# Patient Record
Sex: Male | Born: 1950 | Race: White | Hispanic: No | Marital: Married | Smoking: Former smoker
Health system: Southern US, Community
[De-identification: ages and names within clinical notes are randomized; demographics above are authoritative.]

## PROBLEM LIST (undated history)

## (undated) ENCOUNTER — Emergency Department (HOSPITAL_COMMUNITY)

## (undated) DIAGNOSIS — Z1211 Encounter for screening for malignant neoplasm of colon: Secondary | ICD-10-CM

## (undated) DIAGNOSIS — F419 Anxiety disorder, unspecified: Secondary | ICD-10-CM

## (undated) DIAGNOSIS — E785 Hyperlipidemia, unspecified: Secondary | ICD-10-CM

## (undated) DIAGNOSIS — F329 Major depressive disorder, single episode, unspecified: Secondary | ICD-10-CM

## (undated) DIAGNOSIS — N529 Male erectile dysfunction, unspecified: Secondary | ICD-10-CM

## (undated) DIAGNOSIS — F32A Depression, unspecified: Secondary | ICD-10-CM

## (undated) DIAGNOSIS — Z86010 Personal history of colonic polyps: Secondary | ICD-10-CM

## (undated) DIAGNOSIS — J301 Allergic rhinitis due to pollen: Secondary | ICD-10-CM

## (undated) DIAGNOSIS — K8689 Other specified diseases of pancreas: Secondary | ICD-10-CM

## (undated) DIAGNOSIS — K635 Polyp of colon: Secondary | ICD-10-CM

## (undated) DIAGNOSIS — M1990 Unspecified osteoarthritis, unspecified site: Secondary | ICD-10-CM

## (undated) DIAGNOSIS — N400 Enlarged prostate without lower urinary tract symptoms: Secondary | ICD-10-CM

## (undated) HISTORY — PX: EYE SURGERY: SHX253

## (undated) HISTORY — PX: HERNIA REPAIR: SHX51

## (undated) HISTORY — PX: COLONOSCOPY: SHX174

---

## 2004-12-08 ENCOUNTER — Ambulatory Visit: Payer: Self-pay | Admitting: Internal Medicine

## 2004-12-26 ENCOUNTER — Ambulatory Visit: Payer: Self-pay | Admitting: Internal Medicine

## 2010-02-07 ENCOUNTER — Ambulatory Visit: Payer: Self-pay | Admitting: Unknown Physician Specialty

## 2010-02-08 LAB — PATHOLOGY REPORT

## 2011-05-15 ENCOUNTER — Ambulatory Visit: Payer: Self-pay | Admitting: Unknown Physician Specialty

## 2011-05-16 LAB — PATHOLOGY REPORT

## 2011-12-06 DIAGNOSIS — F419 Anxiety disorder, unspecified: Secondary | ICD-10-CM | POA: Insufficient documentation

## 2011-12-06 DIAGNOSIS — F329 Major depressive disorder, single episode, unspecified: Secondary | ICD-10-CM | POA: Insufficient documentation

## 2011-12-06 DIAGNOSIS — F325 Major depressive disorder, single episode, in full remission: Secondary | ICD-10-CM | POA: Insufficient documentation

## 2011-12-06 DIAGNOSIS — N400 Enlarged prostate without lower urinary tract symptoms: Secondary | ICD-10-CM | POA: Insufficient documentation

## 2011-12-06 DIAGNOSIS — R319 Hematuria, unspecified: Secondary | ICD-10-CM | POA: Insufficient documentation

## 2011-12-06 DIAGNOSIS — N529 Male erectile dysfunction, unspecified: Secondary | ICD-10-CM | POA: Insufficient documentation

## 2011-12-06 DIAGNOSIS — N402 Nodular prostate without lower urinary tract symptoms: Secondary | ICD-10-CM | POA: Insufficient documentation

## 2011-12-06 DIAGNOSIS — K635 Polyp of colon: Secondary | ICD-10-CM | POA: Insufficient documentation

## 2011-12-06 DIAGNOSIS — Z0189 Encounter for other specified special examinations: Secondary | ICD-10-CM | POA: Insufficient documentation

## 2011-12-06 DIAGNOSIS — J301 Allergic rhinitis due to pollen: Secondary | ICD-10-CM | POA: Insufficient documentation

## 2011-12-06 DIAGNOSIS — N4289 Other specified disorders of prostate: Secondary | ICD-10-CM | POA: Insufficient documentation

## 2013-06-22 DIAGNOSIS — M1990 Unspecified osteoarthritis, unspecified site: Secondary | ICD-10-CM | POA: Insufficient documentation

## 2014-03-30 ENCOUNTER — Ambulatory Visit: Payer: Self-pay | Admitting: Urology

## 2014-04-15 DIAGNOSIS — K8689 Other specified diseases of pancreas: Secondary | ICD-10-CM | POA: Insufficient documentation

## 2014-04-16 ENCOUNTER — Ambulatory Visit: Payer: Self-pay | Admitting: Urology

## 2015-05-08 ENCOUNTER — Encounter: Payer: Self-pay | Admitting: *Deleted

## 2015-05-08 ENCOUNTER — Emergency Department
Admission: EM | Admit: 2015-05-08 | Discharge: 2015-05-09 | Disposition: A | Discharge status: Home / Self Care | Payer: Medicare Other | Attending: Emergency Medicine | Admitting: Emergency Medicine

## 2015-05-08 DIAGNOSIS — F32A Depression, unspecified: Secondary | ICD-10-CM

## 2015-05-08 DIAGNOSIS — F332 Major depressive disorder, recurrent severe without psychotic features: Secondary | ICD-10-CM | POA: Diagnosis not present

## 2015-05-08 DIAGNOSIS — F1310 Sedative, hypnotic or anxiolytic abuse, uncomplicated: Secondary | ICD-10-CM | POA: Insufficient documentation

## 2015-05-08 DIAGNOSIS — Z79899 Other long term (current) drug therapy: Secondary | ICD-10-CM | POA: Diagnosis not present

## 2015-05-08 DIAGNOSIS — G8929 Other chronic pain: Secondary | ICD-10-CM | POA: Insufficient documentation

## 2015-05-08 DIAGNOSIS — F329 Major depressive disorder, single episode, unspecified: Secondary | ICD-10-CM | POA: Diagnosis not present

## 2015-05-08 DIAGNOSIS — Z87891 Personal history of nicotine dependence: Secondary | ICD-10-CM | POA: Diagnosis not present

## 2015-05-08 DIAGNOSIS — M549 Dorsalgia, unspecified: Secondary | ICD-10-CM | POA: Diagnosis not present

## 2015-05-08 DIAGNOSIS — R45851 Suicidal ideations: Secondary | ICD-10-CM | POA: Diagnosis present

## 2015-05-08 HISTORY — DX: Major depressive disorder, single episode, unspecified: F32.9

## 2015-05-08 HISTORY — DX: Depression, unspecified: F32.A

## 2015-05-08 HISTORY — DX: Anxiety disorder, unspecified: F41.9

## 2015-05-08 LAB — COMPREHENSIVE METABOLIC PANEL
ALK PHOS: 52 U/L (ref 38–126)
ALT: 26 U/L (ref 17–63)
AST: 22 U/L (ref 15–41)
Albumin: 4.2 g/dL (ref 3.5–5.0)
Anion gap: 9 (ref 5–15)
BILIRUBIN TOTAL: 0.7 mg/dL (ref 0.3–1.2)
BUN: 19 mg/dL (ref 6–20)
CO2: 27 mmol/L (ref 22–32)
Calcium: 8.8 mg/dL — ABNORMAL LOW (ref 8.9–10.3)
Chloride: 105 mmol/L (ref 101–111)
Creatinine, Ser: 1.09 mg/dL (ref 0.61–1.24)
Glucose, Bld: 108 mg/dL — ABNORMAL HIGH (ref 65–99)
Potassium: 4 mmol/L (ref 3.5–5.1)
Sodium: 141 mmol/L (ref 135–145)
TOTAL PROTEIN: 6.8 g/dL (ref 6.5–8.1)

## 2015-05-08 LAB — CBC
HEMATOCRIT: 45.5 % (ref 40.0–52.0)
Hemoglobin: 15.2 g/dL (ref 13.0–18.0)
MCH: 33.7 pg (ref 26.0–34.0)
MCHC: 33.5 g/dL (ref 32.0–36.0)
MCV: 100.7 fL — ABNORMAL HIGH (ref 80.0–100.0)
PLATELETS: 221 10*3/uL (ref 150–440)
RBC: 4.52 MIL/uL (ref 4.40–5.90)
RDW: 12.7 % (ref 11.5–14.5)
WBC: 8.5 10*3/uL (ref 3.8–10.6)

## 2015-05-08 LAB — URINE DRUG SCREEN, QUALITATIVE (ARMC ONLY)
AMPHETAMINES, UR SCREEN: NOT DETECTED
BARBITURATES, UR SCREEN: NOT DETECTED
BENZODIAZEPINE, UR SCRN: NOT DETECTED
Cannabinoid 50 Ng, Ur ~~LOC~~: NOT DETECTED
Cocaine Metabolite,Ur ~~LOC~~: NOT DETECTED
MDMA (Ecstasy)Ur Screen: NOT DETECTED
METHADONE SCREEN, URINE: NOT DETECTED
OPIATE, UR SCREEN: NOT DETECTED
Phencyclidine (PCP) Ur S: NOT DETECTED
TRICYCLIC, UR SCREEN: POSITIVE — AB

## 2015-05-08 LAB — SALICYLATE LEVEL

## 2015-05-08 LAB — ETHANOL

## 2015-05-08 LAB — ACETAMINOPHEN LEVEL

## 2015-05-08 NOTE — ED Notes (Addendum)
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

## 2015-05-08 NOTE — BH Assessment (Signed)
Assessment Note  Stuart Wise. is an 65 y.o. male. Stuart Wise reports to the ED by way of his wife.  He reports that he is depressed.  He states that this has "been going on for quite a while, and the medicine is just not doing it for me anymore".  He reports that he is not seeing a psychiatrist, but is scheduled for an appointment on February 21.  He states that he does not have any "get up and go".  He states that he is just existing.  He reports that he is laying in bed all day and lays awake at night. He states that he is isolating himself from others. He shared that he is not enjoying anything though he has everything to be happy about.  He denied current anxiety.  He denied having auditory or visual hallucinations. He denied suicidal or homicidal ideation or intent.  He denied a problem with alcohol or use of illegal drugs.  He states this general practitioner states he wanted him to see a psychiatrist.  Diagnosis: Depression  Past Medical History:  Past Medical History  Diagnosis Date  . Depression   . Anxiety     Past Surgical History  Procedure Laterality Date  . Hernia repair      Family History: History reviewed. No pertinent family history.  Social History:  reports that he has quit smoking. His smoking use included Cigarettes. He has never used smokeless tobacco. He reports that he drinks alcohol. He reports that he does not use illicit drugs.  Additional Social History:  Alcohol / Drug Use History of alcohol / drug use?: No history of alcohol / drug abuse  CIWA: CIWA-Ar BP: 131/72 mmHg Pulse Rate: 96 COWS:    Allergies:  Allergies  Allergen Reactions  . Prednisone     Anxious.    Home Medications:  (Not in a hospital admission)  OB/GYN Status:  No LMP for male patient.  General Assessment Data Location of Assessment: [facility] ED TTS Assessment: In system Is this a Tele or Face-to-Face Assessment?: Face-to-Face Is this an Initial Assessment or a  Re-assessment for this encounter?: Initial Assessment Marital status: Married [place] name: n/a Is patient pregnant?: No Pregnancy Status: No Living Arrangements: Spouse/significant other Can pt return to current living arrangement?: Yes Admission Status: Voluntary Is patient capable of signing voluntary admission?: Yes Referral Source: Self/Family/Friend Insurance type: medicare  Medical Screening Exam ([place]) Medical Exam completed: Yes  Crisis Care Plan Living Arrangements: Spouse/significant other Legal Guardian: Other: (Self) Name of Psychiatrist: None at this time Name of Therapist: None at this time  Education Status Is patient currently in school?: No Current Grade: n/a Highest grade of school patient has completed: 12th Name of school: [employer] person: n/a  Risk to self with the past 6 months Suicidal Ideation: No Has patient been a risk to self within the past 6 months prior to admission? : No Suicidal Intent: No Has patient had any suicidal intent within the past 6 months prior to admission? : No Is patient at risk for suicide?: No Suicidal Plan?: No Has patient had any suicidal plan within the past 6 months prior to admission? : No Access to Means: No What has been your use of drugs/alcohol within the last 12 months?: Denied Previous Attempts/Gestures: No How many times?: 0 Other Self Harm Risks: Denied Triggers for Past Attempts: None known Intentional Self Injurious Behavior: None Family Suicide History: No Recent stressful life event(s):  (  None reported) Persecutory voices/beliefs?: No Depression: Yes Depression Symptoms: Insomnia, Isolating, Loss of interest in usual pleasures Substance abuse history and/or treatment for substance abuse?: No Suicide prevention information given to non-admitted patients: Not applicable  Risk to Others within the past 6 months Homicidal Ideation: No Does patient have any lifetime risk of  violence toward others beyond the six months prior to admission? : No Thoughts of Harm to Others: No Current Homicidal Intent: No Current Homicidal Plan: No Access to Homicidal Means: No Identified Victim: None reported History of harm to others?: No Assessment of Violence: None Noted Violent Behavior Description: denied Does patient have access to weapons?: No Criminal Charges Pending?: No Does patient have a court date: No Is patient on probation?: No  Psychosis Hallucinations: None noted Delusions: None noted  Mental Status Report Appearance/Hygiene: In scrubs, Unremarkable Eye Contact: Fair Motor Activity: Unremarkable Speech: Logical/coherent Level of Consciousness: Alert Mood: Depressed Affect: Flat Anxiety Level: None Thought Processes: Coherent Judgement: Unimpaired Orientation: Place, Person, Time, Situation Obsessive Compulsive Thoughts/Behaviors: None  Cognitive Functioning Concentration: Decreased Memory: Recent Intact IQ: Average Insight: Fair Impulse Control: Good Appetite: Good Sleep: Decreased  ADLScreening ([facility]) Patient's cognitive ability adequate to safely complete daily activities?: Yes Patient able to express need for assistance with ADLs?: Yes Independently performs ADLs?: Yes (appropriate for developmental age)  Prior Inpatient Therapy Prior Inpatient Therapy: Yes Prior Therapy Dates: 1983 Prior Therapy Facilty/Provider(s): [facility] Reason for Treatment: Depression "Nervous Breakdown"   Prior Outpatient Therapy Prior Outpatient Therapy: Yes Prior Therapy Dates: 1983-1984 Prior Therapy Facilty/Provider(s): [facility] Reason for Treatment: Depression Does patient have an ACCT team?: No Does patient have Intensive In-House Services?  : No Does patient have [facility] services? : No Does patient have P4CC services?: No  ADL Screening (condition at time of admission) Patient's cognitive ability  adequate to safely complete daily activities?: Yes Patient able to express need for assistance with ADLs?: Yes Independently performs ADLs?: Yes (appropriate for developmental age)       Abuse/Neglect Assessment (Assessment to be complete while patient is alone) Physical Abuse: Denies Verbal Abuse: Denies Sexual Abuse: Denies Exploitation of patient/patient's resources: Denies Self-Neglect: Denies Values / Beliefs Cultural Requests During Hospitalization: None Spiritual Requests During Hospitalization: None   Advance Directives (For Healthcare) Does patient have an advance directive?: No Would patient like information on creating an advanced directive?: Yes - Educational materials given    Additional Information 1:1 In Past 12 Months?: No CIRT Risk: No Elopement Risk: No Does patient have medical clearance?: Yes     Disposition:  Disposition Initial Assessment Completed for this Encounter: Yes Disposition of Patient: Other dispositions  On Site Evaluation by:   Reviewed with Physician:    Elmer Bales 05/08/2015 10:14 PM

## 2015-05-08 NOTE — ED Notes (Signed)
Pt has chronic hx of depression and anxiety. Pt denies prior attempts and plan, endorses SI w/o intent. Pt states he "doesn't want to be a burden on anybody."

## 2015-05-08 NOTE — ED Notes (Signed)
Pt's medication bottles sent to pharmacy for storage with med storage/destruction record filled out; copy of form with pt's chart in ED

## 2015-05-08 NOTE — ED Notes (Signed)
TTS keisha at bedside

## 2015-05-08 NOTE — ED Provider Notes (Addendum)
[facility] Emergency Department Provider Note  ____________________________________________  Time seen: Approximately 9:46 PM  I have reviewed the triage vital signs and the nursing notes.   HISTORY  Chief Complaint Suicidal    HPI Stuart Wise. is a 65 y.o. male complains of depression going on 2 years since he retired. 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 Lately he's been having thoughts of hurting himself. He says when I asked him about a plan to let her know anything. Patient reports chronic back pain as well. Assuming [facility] for his healthcare   Past Medical History  Diagnosis Date  . Depression   . Anxiety    also chronic back pain   There are no active problems to display for this patient.   Past Surgical History  Procedure Laterality Date  . Hernia repair      Current Outpatient Rx  Name  Route  Sig  Dispense  Refill  . doxepin (SINEQUAN) 10 MG capsule   Oral   Take 10 mg by mouth at bedtime.         . gabapentin (NEURONTIN) 300 MG capsule   Oral   Take 300 mg by mouth daily.         Marland Kitchen PARoxetine (PAXIL) 40 MG tablet   Oral   Take 40 mg by mouth every morning.           Allergies Prednisone  History reviewed. No pertinent family history.  Social History Social History  Substance Use Topics  . Smoking status: Former Smoker    Types: Cigarettes  . Smokeless tobacco: Never Used  . Alcohol Use: Yes     Comment: rarely    Review of Systems Constitutional: No fever/chills Eyes: No visual changes. ENT: No sore throat. Cardiovascular: Denies chest pain. Respiratory: Denies shortness of breath. Gastrointestinal: No abdominal pain.  No nausea, no vomiting.  No diarrhea.  No constipation. Genitourinary: Negative for dysuria. Musculoskeletal: back pain. Skin: Negative for rash. Neurological: Negative for headaches, focal weakness or numbness.  10-point ROS  otherwise negative.  ____________________________________________   PHYSICAL EXAM:  VITAL SIGNS: ED Triage Vitals  Enc Vitals Group     BP 05/08/15 2054 131/72 mmHg     Pulse Rate 05/08/15 2054 96     Resp 05/08/15 2054 18     Temp 05/08/15 2054 97.7 F (36.5 C)     Temp Source 05/08/15 2054 Oral     SpO2 05/08/15 2054 100 %     Weight 05/08/15 2054 211 lb (95.709 kg)     Height 05/08/15 2054 6' (1.829 m)     Head Cir --      Peak Flow --      Pain Score --      Pain Loc --      Pain Edu? --      Excl. in [place]? --     Constitutional: Alert and oriented. Well appearing and in no acute distress. Eyes: Conjunctivae are normal. PERRL. EOMI. Head: Atraumatic. Nose: No congestion/rhinnorhea. Mouth/Throat: Mucous membranes are moist.  Oropharynx non-erythematous. Neck: No stridor.  Cardiovascular: Normal rate, regular rhythm. Grossly normal heart sounds.  Good peripheral circulation. Respiratory: Normal respiratory effort.  No retractions. Lungs CTAB. Gastrointestinal: Soft and nontender. No distention. No abdominal bruits. No CVA tenderness. Musculoskeletal: No lower extremity tenderness nor edema.  No joint effusions. Neurologic:  Normal speech and language. No gross focal neurologic deficits are appreciated.  No gait instability. Skin:  Skin is warm, dry and intact. No rash noted.   ____________________________________________   LABS (all labs ordered are listed, but only abnormal results are displayed)  Labs Reviewed  COMPREHENSIVE METABOLIC PANEL - Abnormal; Notable for the following:    Glucose, Bld 108 (*)    Calcium 8.8 (*)    All other components within normal limits  CBC - Abnormal; Notable for the following:    MCV 100.7 (*)    All other components within normal limits  URINE DRUG SCREEN, QUALITATIVE ([facility] ONLY) - Abnormal; Notable for the following:    Tricyclic, Ur Screen POSITIVE (*)    All other components within normal limits  ETHANOL  SALICYLATE LEVEL   ACETAMINOPHEN LEVEL   ____________________________________________  EKG   ____________________________________________  RADIOLOGY   ____________________________________________   PROCEDURES    ____________________________________________   INITIAL IMPRESSION / ASSESSMENT AND PLAN / ED COURSE  Pertinent labs & imaging results that were available during my care of the patient were reviewed by me and considered in my medical decision making (see chart for details).   ____________________________________________   FINAL CLINICAL IMPRESSION(S) / ED DIAGNOSES  Final diagnoses:  Depressed      Nena Polio, MD 05/08/15 2149  Patient says he is "depressed and has thought about hurting himself but wouldn't do it ."  Nena Polio, MD 05/08/15 2150

## 2015-05-08 NOTE — BHH Counselor (Signed)
Patient refused to participate with TTS consult.  TTS to contact collaterals.

## 2015-05-08 NOTE — ED Notes (Signed)
MD at bedside. 

## 2015-05-09 DIAGNOSIS — F332 Major depressive disorder, recurrent severe without psychotic features: Secondary | ICD-10-CM | POA: Insufficient documentation

## 2015-05-09 MED ORDER — BUPROPION HCL 75 MG PO TABS: 75.0000 mg | ORAL_TABLET | Freq: Every day | ORAL | Status: DC

## 2015-05-09 MED ORDER — PAROXETINE HCL 20 MG PO TABS: 20.0000 mg | ORAL_TABLET | Freq: Every day | ORAL | Status: DC

## 2015-05-09 MED ORDER — BUPROPION HCL 75 MG PO TABS
75.0000 mg | ORAL_TABLET | Freq: Every morning | ORAL | Status: DC
  Administered 2015-05-09: 75 mg via ORAL
  Filled 2015-05-09: qty 1

## 2015-05-09 NOTE — ED Notes (Signed)
Patient resting quietly in room. No noted distress or abnormal behaviors noted. Will continue 15 minute checks and observation by security camera for safety. 

## 2015-05-09 NOTE — Discharge Instructions (Signed)
Major Depressive Disorder Major depressive disorder is a mental illness. It also may be called clinical depression or unipolar depression. Major depressive disorder usually causes feelings of sadness, hopelessness, or helplessness. Some people with this disorder do not feel particularly sad but lose interest in doing things they used to enjoy (anhedonia). Major depressive disorder also can cause physical symptoms. It can interfere with work, school, relationships, and other normal everyday activities. The disorder varies in severity but is longer lasting and more serious than the sadness we all feel from time to time in our lives. Major depressive disorder often is triggered by stressful life events or major life changes. Examples of these triggers include divorce, loss of your job or home, a move, and the death of a family member or close friend. Sometimes this disorder occurs for no obvious reason at all. People who have family members with major depressive disorder or bipolar disorder are at higher risk for developing this disorder, with or without life stressors. Major depressive disorder can occur at any age. It may occur just once in your life (single episode major depressive disorder). It may occur multiple times (recurrent major depressive disorder). SYMPTOMS People with major depressive disorder have either anhedonia or depressed mood on nearly a daily basis for at least 2 weeks or longer. Symptoms of depressed mood include:  Feelings of sadness (blue or down in the dumps) or emptiness.  Feelings of hopelessness or helplessness.  Tearfulness or episodes of crying (may be observed by others).  Irritability (children and adolescents). In addition to depressed mood or anhedonia or both, people with this disorder have at least four of the following symptoms:  Difficulty sleeping or sleeping too much.   Significant change (increase or decrease) in appetite or weight.   Lack of energy or  motivation.  Feelings of guilt and worthlessness.   Difficulty concentrating, remembering, or making decisions.  Unusually slow movement (psychomotor retardation) or restlessness (as observed by others).   Recurrent wishes for death, recurrent thoughts of self-harm (suicide), or a suicide attempt. People with major depressive disorder commonly have persistent negative thoughts about themselves, other people, and the world. People with severe major depressive disorder may experiencedistorted beliefs or perceptions about the world (psychotic delusions). They also may see or hear things that are not real (psychotic hallucinations). DIAGNOSIS Major depressive disorder is diagnosed through an assessment by your health care provider. Your health care provider will ask aboutaspects of your daily life, such as mood,sleep, and appetite, to see if you have the diagnostic symptoms of major depressive disorder. Your health care provider may ask about your medical history and use of alcohol or drugs, including prescription medicines. Your health care provider also may do a physical exam and blood work. This is because certain medical conditions and the use of certain substances can cause major depressive disorder-like symptoms (secondary depression). Your health care provider also may refer you to a mental health specialist for further evaluation and treatment. TREATMENT It is important to recognize the symptoms of major depressive disorder and seek treatment. The following treatments can be prescribed for this disorder:   Medicine. Antidepressant medicines usually are prescribed. Antidepressant medicines are thought to correct chemical imbalances in the brain that are commonly associated with major depressive disorder. Other types of medicine may be added if the symptoms do not respond to antidepressant medicines alone or if psychotic delusions or hallucinations occur.  Talk therapy. Talk therapy can be  helpful in treating major depressive disorder by providing   support, education, and guidance. Certain types of talk therapy also can help with negative thinking (cognitive behavioral therapy) and with relationship issues that trigger this disorder (interpersonal therapy). A mental health specialist can help determine which treatment is best for you. Most people with major depressive disorder do well with a combination of medicine and talk therapy. Treatments involving electrical stimulation of the brain can be used in situations with extremely severe symptoms or when medicine and talk therapy do not work over time. These treatments include electroconvulsive therapy, transcranial magnetic stimulation, and vagal nerve stimulation.   This information is not intended to replace advice given to you by your health care provider. Make sure you discuss any questions you have with your health care provider.   Document Released: 07/14/2012 Document Revised: 04/09/2014 Document Reviewed: 07/14/2012 Elsevier Interactive Patient Education 2016 [employer] Inc.  

## 2015-05-09 NOTE — ED Notes (Signed)
Patient to be discharged but his wife took all his clothes home. He is calling her at work to make arrangements. Discharge instructions reviewed with patient. He verbalizes understanding. Will continue to maintain on all safety checks.

## 2015-05-09 NOTE — ED Notes (Signed)

## 2015-05-09 NOTE — ED Provider Notes (Signed)
-----------------------------------------   11:32 AM on 05/09/2015 -----------------------------------------   Blood pressure 128/71, pulse 81, temperature 98.6 F (37 C), temperature source Oral, resp. rate 20, height 6' (1.829 m), weight 211 lb (95.709 kg), SpO2 100 %.  The patient had no acute events since last update.  Calm and cooperative at this time.  Evaluated by Dr.Faheem who says the patient is appropriate for discharge and follow-up with her in the office. The patient has diagnosis of depression. Per Dr. Wynn Banker the patient should be discharged on Wellbutrin 75 mg daily. Patient not reporting any suicidal or homicidal ideation at this time.    Orbie Pyo, MD 05/09/15 [phone]

## 2015-05-09 NOTE — ED Notes (Signed)
Meal given.  Patient still waiting for his ride.Maintained on 15 minute checks and observation by security camera for safety.

## 2015-05-09 NOTE — ED Notes (Signed)
Patient awake, alert, and oriented. Patient reports he has been compliant with medications but they are "no longer working." Depressed mood, vague SI but no plan or intent and no history of any attempts.  Patient has been cooperative with all nursing interventions.  Maintained on 15 minute checks and observation by security camera for safety.

## 2015-05-09 NOTE — ED Notes (Signed)
Patient discharged ambulatory to self/wife. He denies SI or HI. Patient received all personal belongings.

## 2015-05-09 NOTE — ED Notes (Signed)
Pt resting quietly in bed; no requests or complaints; understands waiting to speak with psychiatrist in the am; lights already out; watching tv

## 2015-05-09 NOTE — Progress Notes (Addendum)
TTS spoke with pt along with MD, Dr, Gretel Acre at pt bedside. Pt. has complaints of passive suicidal ideation but denies any plan or intent. Pt. denies the presence of any auditory or visual hallucinations at this time. Patient denies any other medical complaints. Medication adjustments have been made. Per the request of  Dr Gretel Acre, TTS has as scheduled the pts aftercare appointment with  [facility] for medication management on 05/17/15 @ 3pm.   Testicular self exam taught. Con Memos, MS, [place], LPCA Therapeutic Triage Specialist

## 2015-05-09 NOTE — Progress Notes (Signed)
LCSW and TTS consulted patient will follow up with Dr Gretel Acre out patient services at [facility]. Scheduled follow up appointment Feb 14 th, 2017 3pm

## 2015-05-09 NOTE — ED Notes (Signed)
Patient calm and cooperative. Wife cannot come until after work, approx. 5:30 pm.  Maintained on 15 minute checks and observation by security camera for safety.

## 2015-05-09 NOTE — Consult Note (Signed)
[place] [facility] Consult   Reason for Consult:  Depression Referring Physician:  Emergency room physician Patient Identification: Stuart Wise. MRN:  [id number] Principal Diagnosis: Major depressive disorder recurrent severe without psychotic features Diagnosis: Major depressive disorder recurrent severe without psychotic features   Total Time spent with patient: 1 hour  Subjective:   Stuart Wise. is a 65 y.o. male patient admitted with depressive symptoms.  HPI:  Patient is a 65 year old married male who presented for the emergency room due of his worsening depression. He reported that he was referred by his primary care physician Dr. Molli Barrows at [facility] primary care . Patient reported that he is "out of gas" He described his depressive symptoms as feeling depressed "going too long" and is unable to do anything or everything. He feels anhedonia, hopeless helpless and has lack of energy. He reported that he has been on Paxil 40 mg for the past 9 years and his PCP recently added  doxepin. He reported that the PCP does not want to change his medications. He has a upcoming appointment with a psychiatrist on Feb 21st but he was unable to wait that long. He currently denied  suicidal ideations or plans. He reported that he has thought about it. Patient reported that he does not use any drugs or alcohol at this time. He lives with his wife and has been married for the past 24 years. He is currently retired. He denied having any problems with his family at this time. He reported that he does not have any acute medical problems at this time.  His urine drug screen is only positive for tricyclic antidepressants due to doxepin.  Past Psychiatric History: Patient reported that he has never seen a psychiatrist in the past. He does not have any history of psychiatric hospitalization. He does not have any history of previous suicide attempts.  Risk to Self: Suicidal Ideation:  No Suicidal Intent: No Is patient at risk for suicide?: No Suicidal Plan?: No Access to Means: No What has been your use of drugs/alcohol within the last 12 months?: Denied How many times?: 0 Other Self Harm Risks: Denied Triggers for Past Attempts: None known Intentional Self Injurious Behavior: None Risk to Others: Homicidal Ideation: No Thoughts of Harm to Others: No Current Homicidal Intent: No Current Homicidal Plan: No Access to Homicidal Means: No Identified Victim: None reported History of harm to others?: No Assessment of Violence: None Noted Violent Behavior Description: denied Does patient have access to weapons?: No Criminal Charges Pending?: No Does patient have a court date: No Prior Inpatient Therapy: Prior Inpatient Therapy: Yes Prior Therapy Dates: 1983 Prior Therapy Facilty/Provider(s): [facility] Reason for Treatment: Depression "Nervous Breakdown"  Prior Outpatient Therapy: Prior Outpatient Therapy: Yes Prior Therapy Dates: 1983-1984 Prior Therapy Facilty/Provider(s): [facility] Reason for Treatment: Depression Does patient have an ACCT team?: No Does patient have Intensive In-House Services?  : No Does patient have [facility] services? : No Does patient have P4CC services?: No  Past Medical History:  Past Medical History  Diagnosis Date  . Depression   . Anxiety     Past Surgical History  Procedure Laterality Date  . Hernia repair     Family History: History reviewed. No pertinent family history. Family Psychiatric  History: Denied any history of psychiatric issues in his family members. Social History:  History  Alcohol Use  . Yes    Comment: rarely     History  Drug Use No  Social History   Social History  . Marital Status: Married    Spouse Name: N/A  . Number of Children: N/A  . Years of Education: N/A   Social History Main Topics  . Smoking status: Former Smoker    Types: Cigarettes  . Smokeless tobacco:  Never Used  . Alcohol Use: Yes     Comment: rarely  . Drug Use: No  . Sexual Activity: Not Asked   Other Topics Concern  . None   Social History Narrative  . None   Additional Social History:   currently married and lives with his wife. He stated that he is currently retired.  Allergies:   Allergies  Allergen Reactions  . Prednisone     Anxious.    Labs:  Results for orders placed or performed during the hospital encounter of 05/08/15 (from the past 48 hour(s))  Comprehensive metabolic panel     Status: Abnormal   Collection Time: 05/08/15  9:10 PM  Result Value Ref Range   Sodium 141 135 - 145 mmol/L   Potassium 4.0 3.5 - 5.1 mmol/L   Chloride 105 101 - 111 mmol/L   CO2 27 22 - 32 mmol/L   Glucose, Bld 108 (H) 65 - 99 mg/dL   BUN 19 6 - 20 mg/dL   Creatinine, Ser 1.09 0.61 - 1.24 mg/dL   Calcium 8.8 (L) 8.9 - 10.3 mg/dL   Total Protein 6.8 6.5 - 8.1 g/dL   Albumin 4.2 3.5 - 5.0 g/dL   AST 22 15 - 41 U/L   ALT 26 17 - 63 U/L   Alkaline Phosphatase 52 38 - 126 U/L   Total Bilirubin 0.7 0.3 - 1.2 mg/dL   GFR calc non Af Amer >60 >60 mL/min   GFR calc Af Amer >60 >60 mL/min    Comment: (NOTE) The eGFR has been calculated using the CKD EPI equation. This calculation has not been validated in all clinical situations. eGFR's persistently <60 mL/min signify possible Chronic Kidney Disease.    Anion gap 9 5 - 15  Ethanol (ETOH)     Status: None   Collection Time: 05/08/15  9:10 PM  Result Value Ref Range   Alcohol, Ethyl (B) <5 <5 mg/dL    Comment:        LOWEST DETECTABLE LIMIT FOR SERUM ALCOHOL IS 5 mg/dL FOR MEDICAL PURPOSES ONLY   Salicylate level     Status: None   Collection Time: 05/08/15  9:10 PM  Result Value Ref Range   Salicylate Lvl <3.8 2.8 - 30.0 mg/dL  Acetaminophen level     Status: Abnormal   Collection Time: 05/08/15  9:10 PM  Result Value Ref Range   Acetaminophen (Tylenol), Serum <10 (L) 10 - 30 ug/mL    Comment:        THERAPEUTIC  CONCENTRATIONS VARY SIGNIFICANTLY. A RANGE OF 10-30 ug/mL MAY BE AN EFFECTIVE CONCENTRATION FOR MANY PATIENTS. HOWEVER, SOME ARE BEST TREATED AT CONCENTRATIONS OUTSIDE THIS RANGE. ACETAMINOPHEN CONCENTRATIONS >150 ug/mL AT 4 HOURS AFTER INGESTION AND >50 ug/mL AT 12 HOURS AFTER INGESTION ARE OFTEN ASSOCIATED WITH TOXIC REACTIONS.   CBC     Status: Abnormal   Collection Time: 05/08/15  9:10 PM  Result Value Ref Range   WBC 8.5 3.8 - 10.6 K/uL   RBC 4.52 4.40 - 5.90 MIL/uL   Hemoglobin 15.2 13.0 - 18.0 g/dL   HCT 45.5 40.0 - 52.0 %   MCV 100.7 (H) 80.0 - 100.0 fL  MCH 33.7 26.0 - 34.0 pg   MCHC 33.5 32.0 - 36.0 g/dL   RDW 12.7 11.5 - 14.5 %   Platelets 221 150 - 440 K/uL  Urine Drug Screen, Qualitative ([facility] only)     Status: Abnormal   Collection Time: 05/08/15  9:20 PM  Result Value Ref Range   Tricyclic, Ur Screen POSITIVE (A) NONE DETECTED   Amphetamines, Ur Screen NONE DETECTED NONE DETECTED   MDMA (Ecstasy)Ur Screen NONE DETECTED NONE DETECTED   Cocaine Metabolite,Ur [place] NONE DETECTED NONE DETECTED   Opiate, Ur Screen NONE DETECTED NONE DETECTED   Phencyclidine (PCP) Ur S NONE DETECTED NONE DETECTED   Cannabinoid 50 Ng, Ur [place] NONE DETECTED NONE DETECTED   Barbiturates, Ur Screen NONE DETECTED NONE DETECTED   Benzodiazepine, Ur Scrn NONE DETECTED NONE DETECTED   Methadone Scn, Ur NONE DETECTED NONE DETECTED    Comment: (NOTE) 414  Tricyclics, urine               Cutoff 1000 ng/mL 200  Amphetamines, urine             Cutoff 1000 ng/mL 300  MDMA (Ecstasy), urine           Cutoff 500 ng/mL 400  Cocaine Metabolite, urine       Cutoff 300 ng/mL 500  Opiate, urine                   Cutoff 300 ng/mL 600  Phencyclidine (PCP), urine      Cutoff 25 ng/mL 700  Cannabinoid, urine              Cutoff 50 ng/mL 800  Barbiturates, urine             Cutoff 200 ng/mL 900  Benzodiazepine, urine           Cutoff 200 ng/mL 1000 Methadone, urine                Cutoff 300  ng/mL 1100 1200 The urine drug screen provides only a preliminary, unconfirmed 1300 analytical test result and should not be used for non-medical 1400 purposes. Clinical consideration and professional judgment should 1500 be applied to any positive drug screen result due to possible 1600 interfering substances. A more specific alternate chemical method 1700 must be used in order to obtain a confirmed analytical result.  1800 Gas chromato graphy / mass spectrometry (GC/MS) is the preferred 1900 confirmatory method.     No current facility-administered medications for this encounter.   Current Outpatient Prescriptions  Medication Sig Dispense Refill  . doxepin (SINEQUAN) 10 MG capsule Take 10 mg by mouth at bedtime.    . gabapentin (NEURONTIN) 300 MG capsule Take 300 mg by mouth at bedtime.     Marland Kitchen liothyronine (CYTOMEL) 5 MCG tablet Take 5 mcg by mouth daily.    . naproxen (NAPROSYN) 500 MG tablet Take 500 mg by mouth 2 (two) times daily with a meal.    . PARoxetine (PAXIL) 40 MG tablet Take 40 mg by mouth every morning.    . tamsulosin (FLOMAX) 0.4 MG CAPS capsule Take 0.4 mg by mouth daily after breakfast.      Musculoskeletal: Strength & Muscle Tone: decreased Gait & Station: normal Patient leans: N/A  Psychiatric Specialty Exam: Review of Systems  Psychiatric/Behavioral: Positive for depression. Negative for suicidal ideas, memory loss and substance abuse. The patient is nervous/anxious.   All other systems reviewed and are negative.   Blood pressure 128/71, pulse  81, temperature 98.6 F (37 C), temperature source Oral, resp. rate 20, height 6' (1.829 m), weight 211 lb (95.709 kg), SpO2 100 %.Body mass index is 28.61 kg/(m^2).  General Appearance: Casual and Fairly Groomed  Engineer, water::  Poor  Speech:  Slow  Volume:  Decreased  Mood:  Depressed  Affect:  Appropriate and Congruent  Thought Process:  Coherent and Goal Directed  Orientation:  Full (Time, Place, and Person)   Thought Content:  WDL  Suicidal Thoughts:  No  Homicidal Thoughts:  No  Memory:  Immediate;   Fair  Judgement:  Fair  Insight:  Fair  Psychomotor Activity:  Psychomotor Retardation  Concentration:  Fair  Recall:  [employer] of Knowledge:Fair  Language: Fair  Akathisia:  No  Handed:  Right  AIMS (if indicated):     Assets:  Communication Skills Desire for Improvement Physical Health Social Support  ADL's:  Intact  Cognition: WNL  Sleep:      Treatment Plan Summary: Medication management  Disposition: Patient does not meet criteria for psychiatric inpatient admission. Discussed crisis plan, support from social network, calling 911, coming to the Emergency Department, and calling Suicide Hotline.   Discussed with patient what the medications treatment risk benefits and alternatives. I will start him on Wellbutrin 75 mg in the morning as an add-on to his antidepressant medications. He will continue on Paxil 20 mg at bedtime and has supply of the medications. Advised him to stop the doxepin at this time He will follow up with [facility] in one week- Dr Gretel Acre, and advised patient to call to make the appointment and he demonstrated understanding. He currently denied having any suicidal homicidal ideations or plans Discussed the case with the emergency room physician and the behavioral health staff.   Rainey Pines, MD 05/09/2015 10:27 AM

## 2015-05-09 NOTE — ED Provider Notes (Signed)
-----------------------------------------   6:59 AM on 05/09/2015 -----------------------------------------   Blood pressure 124/83, pulse 69, temperature 97.7 F (36.5 C), temperature source Oral, resp. rate 18, height 6' (1.829 m), weight 211 lb (95.709 kg), SpO2 98 %.  The patient had no acute events since last update.  Calm and cooperative at this time.  Disposition is pending per Psychiatry/Behavioral Medicine team recommendations.     Paulette Blanch, MD 05/09/15 [phone]

## 2015-05-17 ENCOUNTER — Ambulatory Visit: Payer: Medicare Other | Admitting: Psychiatry

## 2015-05-23 ENCOUNTER — Ambulatory Visit (INDEPENDENT_AMBULATORY_CARE_PROVIDER_SITE_OTHER): Payer: Medicare Other | Admitting: Psychiatry

## 2015-05-23 ENCOUNTER — Encounter: Payer: Self-pay | Admitting: Psychiatry

## 2015-05-23 VITALS — BP 124/80 | HR 98 | Temp 98.6°F | Ht 72.0 in | Wt 209.8 lb

## 2015-05-23 DIAGNOSIS — F331 Major depressive disorder, recurrent, moderate: Secondary | ICD-10-CM

## 2015-05-23 MED ORDER — BUPROPION HCL 75 MG PO TABS: 150.0000 mg | ORAL_TABLET | Freq: Every day | ORAL | Status: DC

## 2015-05-23 NOTE — Progress Notes (Signed)
Psychiatric Initial Adult Assessment   Patient Identification: Stuart Wise. MRN:  [id number] Date of Evaluation:  05/23/2015 Referral Source: [facility]- [facility] Chief Complaint:   Chief Complaint    Follow-up     Visit Diagnosis:    ICD-9-CM ICD-10-CM   1. MDD (major depressive disorder), recurrent episode, moderate (HCC) 296.32 F33.1    Diagnosis:   Patient Active Problem List   Diagnosis Date Noted  . MDD (major depressive disorder), recurrent severe, without psychosis ([place]) [F33.2]   . 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 Arthritis, degenerative [M19.90] 06/22/2013  . Anxiety [F41.9] 12/06/2011  . 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 Colon polyp [K63.5] 12/06/2011  . 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 Hay fever [J30.1] 12/06/2011  . Blood in the urine [R31.9] 12/06/2011  . Major depressive episode [F32.9] 12/06/2011  . Prostate lump [N42.9] 12/06/2011  . Encounter for other specified special examinations [Z01.89] 12/06/2011   History of Present Illness:   Patient is a 65 year old male who was initially seen at the [facility] ER presented for initial assessment. He reported that he has started feeling better since his medications were adjusted at the emergency room. He reported that he was initially referred by his primary care physician Dr. Molli Barrows at [facility] primary care. He reported that his medications are adjusted and he was started on Wellbutrin 75 mg in the morning and Paxil 20 mg at bedtime. He reported that Dr. Molli Barrows has also given him Cytomel 5 mg as an add-on to his antidepressant medications. He stated that he does not have any issues with his thyroid at this time. 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 Since then he noticed that his depressive symptoms have been getting worse. He is looking forward for his wife to retire next  month. She works in  [employer]. He reported that she is going to help him with his motivation and to continue his work. He reported that he has been married for the past 62 years. Patient currently is currently retired and spends most of the time at home. He currently denied using any drugs or alcohol. He reported that he does not have any thoughts to hurt himself. He appeared pleasant and cooperative during the interview.   Elements:  Severity:  moderate. Associated Signs/Symptoms: Depression Symptoms:  depressed mood, anhedonia, fatigue, feelings of worthlessness/guilt, difficulty concentrating, hopelessness, loss of energy/fatigue, (Hypo) Manic Symptoms:  none Anxiety Symptoms:  none Psychotic Symptoms:  none PTSD Symptoms: Negative NA  Past Medical History:  Past Medical History  Diagnosis Date  . Depression   . Anxiety     Past Surgical History  Procedure Laterality Date  . Hernia repair     Family History:  Family History  Problem Relation Age of Onset  . Depression Mother   . Hypertension Mother   . Hyperlipidemia Mother   . Depression Father   . Hypertension Father   . Hyperlipidemia Father   . Depression Sister    Social History:   Social History   Social History  . Marital Status: Married    Spouse Name: N/A  . Number of Children: N/A  . Years of Education: N/A   Social History Main Topics  . Smoking status: Former Smoker    Types: Cigarettes  . Smokeless tobacco: Never Used  . Alcohol Use: No     Comment: rarely  .  Drug Use: No  . Sexual Activity: No   Other Topics Concern  . None   Social History Narrative   Additional Social History:  He  is currently married for the past 43 years. He reported that one of his older son is deceased. His other son is a Engineer, structural in [place]. He has 3 grandchildren.  Musculoskeletal: Strength & Muscle Tone: within normal limits Gait & Station: normal Patient leans: N/A  Psychiatric Specialty  Exam: HPI  ROS  Blood pressure 124/80, pulse 98, temperature 98.6 F (37 C), temperature source Tympanic, height 6' (1.829 m), weight 209 lb 12.8 oz (95.165 kg), SpO2 96 %.Body mass index is 28.45 kg/(m^2).  General Appearance: Casual  Eye Contact:  Fair  Speech:  Clear and Coherent and Normal Rate  Volume:  Normal  Mood:  Depressed  Affect:  Congruent  Thought Process:  Coherent and Goal Directed  Orientation:  Full (Time, Place, and Person)  Thought Content:  WDL  Suicidal Thoughts:  No  Homicidal Thoughts:  No  Memory:  Immediate;   Fair  Judgement:  Fair  Insight:  Fair  Psychomotor Activity:  Normal  Concentration:  Fair  Recall:  [employer] of Knowledge:Fair  Language: Fair  Akathisia:  No  Handed:  Right  AIMS (if indicated):    Assets:  Communication Skills Desire for Improvement Physical Health Social Support  ADL's:  Intact  Cognition: WNL  Sleep:      Allergies:   Allergies  Allergen Reactions  . Prednisone     Anxious.   Current Medications: Current Outpatient Prescriptions  Medication Sig Dispense Refill  . buPROPion (WELLBUTRIN) 75 MG tablet Take 1 tablet (75 mg total) by mouth daily. 90 tablet 2  . doxepin (SINEQUAN) 10 MG capsule Take 10 mg by mouth at bedtime.    . fluticasone (FLONASE) 50 MCG/ACT nasal spray Place into the nose.    . gabapentin (NEURONTIN) 300 MG capsule Take 300 mg by mouth at bedtime.     Marland Kitchen liothyronine (CYTOMEL) 5 MCG tablet Take 5 mcg by mouth daily.    . naproxen (NAPROSYN) 500 MG tablet Take 500 mg by mouth 2 (two) times daily with a meal.    . PARoxetine (PAXIL) 40 MG tablet Take 40 mg by mouth. Take 1/2 tablet at bedtime    . sildenafil (VIAGRA) 100 MG tablet Take by mouth.    . tamsulosin (FLOMAX) 0.4 MG CAPS capsule Take 0.4 mg by mouth daily after breakfast.     No current facility-administered medications for this visit.    Previous Psychotropic Medications:  He stated that he has tried Klonopin in the past and  was taking it for many years but his PCP has stopped it as he does not like to prescribe Klonopin. 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 However he has stopped drinking since 1983.  Substance Abuse History in the last 12 months:  No.  Consequences of Substance Abuse: Negative NA  Medical Decision Making:  Review of Psycho-Social Stressors (1) and Review and summation of old records (2)  Treatment Plan Summary: Medication management   Discussed with patient about his medications. He will continue on Wellbutrin and I will titrate the dose to 150 mg in the morning. He will continue on Paxil 20 mg at bedtime I have ordered labs including TSH B-12 and folate  He will follow up in 1 month or  earlier depending on his symptoms   More than 50% of the time spent in psychoeducation, counseling and coordination of care.    This note was generated in part or whole with voice recognition software. Voice regonition is usually quite accurate but there are transcription errors that can and very often do occur. I apologize for any typographical errors that were not detected and corrected.   Rainey Pines, MD    2/20/20173:09 PM

## 2015-06-02 ENCOUNTER — Telehealth: Payer: Self-pay

## 2015-06-02 ENCOUNTER — Encounter: Payer: Self-pay | Admitting: Psychiatry

## 2015-06-02 NOTE — Telephone Encounter (Signed)
left message to call office back in regards to his message

## 2015-06-02 NOTE — Telephone Encounter (Signed)
called patient gave instruction per dr. Gretel Acre .

## 2015-06-02 NOTE — Telephone Encounter (Signed)
pt called back states that he can not take the wellbutirn that dr. Gretel Acre put him on.  he is very nauseas with medications.   Pt was last seen on  05-23-15 next appt on  06-20-15

## 2015-06-02 NOTE — Telephone Encounter (Signed)
pt called [facility] about medications

## 2015-06-02 NOTE — Telephone Encounter (Signed)
per dr. Gretel Acre make sure patient has stopped taking the cytomel and if patient has stopped taking the cytomel then have patient decrease wellbutrin to 75mg  once daily.  but if patient has not stopped the cytomel then have patient stop cytomel and continue With the wellbutrin 75mg  twice daily  If patient does that and still feels nausea then have patient decrease to 75mg  once daily.

## 2015-06-20 ENCOUNTER — Encounter: Payer: Self-pay | Admitting: Psychiatry

## 2015-06-20 ENCOUNTER — Ambulatory Visit (INDEPENDENT_AMBULATORY_CARE_PROVIDER_SITE_OTHER): Payer: Medicare Other | Admitting: Psychiatry

## 2015-06-20 VITALS — BP 140/82 | HR 88 | Temp 98.5°F | Ht 72.0 in | Wt 210.0 lb

## 2015-06-20 DIAGNOSIS — F331 Major depressive disorder, recurrent, moderate: Secondary | ICD-10-CM | POA: Diagnosis not present

## 2015-06-20 MED ORDER — PAROXETINE HCL 10 MG PO TABS: 10.0000 mg | ORAL_TABLET | Freq: Every day | ORAL | Status: DC

## 2015-06-20 MED ORDER — BUPROPION HCL 75 MG PO TABS: 75.0000 mg | ORAL_TABLET | Freq: Every day | ORAL | Status: DC

## 2015-06-20 NOTE — Progress Notes (Signed)
Psychiatric MD Follow up NOTE  Patient Identification: Stuart Wise. MRN:  [id number] Date of Evaluation:  06/20/2015 Referral Source: ER- [place] Chief Complaint:   Chief Complaint    Follow-up; Medication Refill     Visit Diagnosis:    ICD-9-CM ICD-10-CM   1. MDD (major depressive disorder), recurrent episode, moderate (HCC) 296.32 F33.1    Diagnosis:   Patient Active Problem List   Diagnosis Date Noted  . MDD (major depressive disorder), recurrent severe, without psychosis ([place]) [F33.2]   . 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 Arthritis, degenerative [M19.90] 06/22/2013  . Anxiety [F41.9] 12/06/2011  . 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 Colon polyp [K63.5] 12/06/2011  . 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 Hay fever [J30.1] 12/06/2011  . Blood in the urine [R31.9] 12/06/2011  . Major depressive episode [F32.9] 12/06/2011  . Prostate lump [N42.9] 12/06/2011  . Encounter for other specified special examinations [Z01.89] 12/06/2011   History of Present Illness:   Patient is a 65 year old male who Presented for follow-up appointment. He reported that he has called earlier as he was feeling nauseous due to Wellbutrin. However on closer interview patient reported that he wakes up nauseous every morning. He reported that he does not know the reason for nausea in the morning. He takes Paxil at that time. He has decrease the dose to 20 mg at this time. Patient reported that he spends most of the time at home and  does not do anything special. He has been retired and does not enjoy his retirement at this time. He reported that his wife is going to retire in the next 2 months and she is very excited about the same. He stated that he has already stopped taking the Cytomel which was prescribed by his primary care physician and is not taking any thyroid medication. He takes Viagra on a when necessary basis. Patient reported that he wants his  medications to be adjusted. He appeared apprehensive during the interview. He currently denied having any suicidal homicidal ideations or plans. He reported that he has started feeling better since his medications were adjusted at the emergency room. He reported that he has been married for the past 31 years.  He spends most of the time at home. He currently denied using any drugs or alcohol. He reported that he does not have any thoughts to hurt himself. He appeared pleasant and cooperative during the interview.   Elements:  Severity:  moderate. Associated Signs/Symptoms: Depression Symptoms:  depressed mood, anhedonia, fatigue, feelings of worthlessness/guilt, difficulty concentrating, hopelessness, loss of energy/fatigue, (Hypo) Manic Symptoms:  none Anxiety Symptoms:  none Psychotic Symptoms:  none PTSD Symptoms: Negative NA  Past Medical History:  Past Medical History  Diagnosis Date  . Depression   . Anxiety     Past Surgical History  Procedure Laterality Date  . Hernia repair     Family History:  Family History  Problem Relation Age of Onset  . Depression Mother   . Hypertension Mother   . Hyperlipidemia Mother   . Depression Father   . Hypertension Father   . Hyperlipidemia Father   . Depression Sister    Social History:   Social History   Social History  . Marital Status: Married    Spouse Name: N/A  . Number of Children: N/A  . Years of Education: N/A   Social History Main Topics  . Smoking status: Former Smoker    Types: Cigarettes  .  Smokeless tobacco: Never Used  . Alcohol Use: No     Comment: rarely  . Drug Use: No  . Sexual Activity: No   Other Topics Concern  . None   Social History Narrative   Additional Social History:  He  is currently married for the past 43 years. He reported that one of his older son is deceased. His other son is a Engineer, structural in [place]. He has 3 grandchildren.  Musculoskeletal: Strength & Muscle Tone:  within normal limits Gait & Station: normal Patient leans: N/A  Psychiatric Specialty Exam: HPI   ROS   Blood pressure 140/82, pulse 88, temperature 98.5 F (36.9 C), temperature source Tympanic, height 6' (1.829 m), weight 210 lb (95.255 kg), SpO2 98 %.Body mass index is 28.47 kg/(m^2).  General Appearance: Casual  Eye Contact:  Fair  Speech:  Clear and Coherent and Normal Rate  Volume:  Normal  Mood:  Depressed  Affect:  Congruent  Thought Process:  Coherent and Goal Directed  Orientation:  Full (Time, Place, and Person)  Thought Content:  WDL  Suicidal Thoughts:  No  Homicidal Thoughts:  No  Memory:  Immediate;   Fair  Judgement:  Fair  Insight:  Fair  Psychomotor Activity:  Normal  Concentration:  Fair  Recall:  [employer] of Knowledge:Fair  Language: Fair  Akathisia:  No  Handed:  Right  AIMS (if indicated):    Assets:  Communication Skills Desire for Improvement Physical Health Social Support  ADL's:  Intact  Cognition: WNL  Sleep:      Allergies:   Allergies  Allergen Reactions  . Prednisone     Anxious.   Current Medications: Current Outpatient Prescriptions  Medication Sig Dispense Refill  . buPROPion (WELLBUTRIN) 75 MG tablet Take 2 tablets (150 mg total) by mouth daily. Pt has supply 90 tablet 2  . fluticasone (FLONASE) 50 MCG/ACT nasal spray Place into the nose.    . naproxen (NAPROSYN) 500 MG tablet Take 500 mg by mouth 2 (two) times daily with a meal.    . PARoxetine (PAXIL) 40 MG tablet Take 40 mg by mouth. Take 1/2 tablet at bedtime    . sildenafil (VIAGRA) 100 MG tablet Take by mouth.    . tamsulosin (FLOMAX) 0.4 MG CAPS capsule Take 0.4 mg by mouth daily after breakfast.     No current facility-administered medications for this visit.    Previous Psychotropic Medications:  He stated that he has tried Klonopin in the past and was taking it for many years but his PCP has stopped it as he does not like to prescribe Klonopin. Patient  mentioned that he was admitted to the inpatient psychiatric unit as he was a heavy drinker in the past. However he has stopped drinking since 1983.  Substance Abuse History in the last 12 months:  No.  Consequences of Substance Abuse: Negative NA  Medical Decision Making:  Review of Psycho-Social Stressors (1) and Review and summation of old records (2)  Treatment Plan Summary: Medication management   Discussed with patient about his medications. He will continue on Wellbutrin and I will decrease  the dose to 75 mg in the morning. He will continue on Paxil 10 mg at bedtime  He will follow up in 1 month or earlier depending on his symptoms   More than 50% of the time spent in psychoeducation, counseling and coordination of care.    This note was generated in part or whole with voice recognition software.  Voice regonition is usually quite accurate but there are transcription errors that can and very often do occur. I apologize for any typographical errors that were not detected and corrected.   Rainey Pines, MD    3/20/20171:09 PM

## 2015-07-18 ENCOUNTER — Ambulatory Visit (INDEPENDENT_AMBULATORY_CARE_PROVIDER_SITE_OTHER): Payer: Medicare Other | Admitting: Psychiatry

## 2015-07-18 ENCOUNTER — Encounter: Payer: Self-pay | Admitting: Psychiatry

## 2015-07-18 VITALS — BP 108/70 | HR 88 | Temp 97.8°F | Ht 72.0 in | Wt 215.2 lb

## 2015-07-18 DIAGNOSIS — F331 Major depressive disorder, recurrent, moderate: Secondary | ICD-10-CM

## 2015-07-18 MED ORDER — PAROXETINE HCL 10 MG PO TABS: 10.0000 mg | ORAL_TABLET | Freq: Every day | ORAL | Status: DC

## 2015-07-18 MED ORDER — BUPROPION HCL 75 MG PO TABS: 75.0000 mg | ORAL_TABLET | Freq: Every day | ORAL | Status: DC

## 2015-07-18 NOTE — Progress Notes (Signed)
Psychiatric MD Follow up NOTE  Patient Identification: Stuart Wise. MRN:  [id number] Date of Evaluation:  07/18/2015 Referral Source: ER-  Chief Complaint:   Chief Complaint    Follow-up; Medication Refill     Visit Diagnosis:    ICD-9-CM ICD-10-CM   1. MDD (major depressive disorder), recurrent episode, moderate (HCC) 296.32 F33.1    Diagnosis:   Patient Active Problem List   Diagnosis Date Noted  . MDD (major depressive disorder), recurrent severe, without psychosis ([place]) [F33.2]   . 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 Arthritis, degenerative [M19.90] 06/22/2013  . Anxiety [F41.9] 12/06/2011  . 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 Colon polyp [K63.5] 12/06/2011  . 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 Hay fever [J30.1] 12/06/2011  . Blood in the urine [R31.9] 12/06/2011  . Major depressive episode [F32.9] 12/06/2011  . Prostate lump [N42.9] 12/06/2011  . Encounter for other specified special examinations [Z01.89] 12/06/2011   History of Present Illness:   Patient is a 65 year old male who Presented for follow-up appointment. He reported that he has Has been doing well and reported that he enjoyed his time with his wife. [place] both went to the [place] in [place] and he spent the whole week together. He reported that his wife has recently retired and she is very happy. He reported that he has been doing well and the current combination of medications. He takes Paxil 10 mg at night and Wellbutrin 75 mg in the morning. He reported that his symptoms are significantly improved. He does not feel depressed anxious or has any other type of symptoms. Patient reported that his symptoms are currently in improving. He reported that he sleeps well at night. Patient reported that he ran out of his Wellbutrin yesterday. He called the pharmacy but they are not filling his medications as they reported that he has already taken a  90 day supply of the medication and the insurance will not fill until next month. He reported that he is currently having withdrawal symptoms. I also called the pharmacy and they reported that they have given him a 90 day supply of the medication in [place].   Patient currently denied having any more swings anger anxiety or paranoia. He appeared very calm and alert during the interview.  He spends most of the time at home. He currently denied using any drugs or alcohol. He reported that he does not have any thoughts to hurt himself. He appeared pleasant and cooperative during the interview.   Elements:  Severity:  moderate. Associated Signs/Symptoms: Depression Symptoms:  depressed mood, anhedonia, (Hypo) Manic Symptoms:  none Anxiety Symptoms:  none Psychotic Symptoms:  none PTSD Symptoms: Negative NA  Past Medical History:  Past Medical History  Diagnosis Date  . Depression   . Anxiety     Past Surgical History  Procedure Laterality Date  . Hernia repair     Family History:  Family History  Problem Relation Age of Onset  . Depression Mother   . Hypertension Mother   . Hyperlipidemia Mother   . Depression Father   . Hypertension Father   . Hyperlipidemia Father   . Depression Sister    Social History:   Social History   Social History  . Marital Status: Married    Spouse Name: N/A  . Number of Children: N/A  . Years of Education: N/A   Social History Main Topics  . Smoking status: Former Smoker  Types: Cigarettes  . Smokeless tobacco: Never Used  . Alcohol Use: No     Comment: rarely  . Drug Use: No  . Sexual Activity: No   Other Topics Concern  . None   Social History Narrative   Additional Social History:  He  is currently married for the past 43 years. He reported that one of his older son is deceased. His other son is a Engineer, structural in [place]. He has 3 grandchildren.  Musculoskeletal: Strength & Muscle Tone: within normal limits Gait &  Station: normal Patient leans: N/A  Psychiatric Specialty Exam: HPI  ROS  Blood pressure 108/70, pulse 88, temperature 97.8 F (36.6 C), temperature source Tympanic, height 6' (1.829 m), weight 215 lb 3.2 oz (97.614 kg), SpO2 95 %.Body mass index is 29.18 kg/(m^2).  General Appearance: Casual  Eye Contact:  Fair  Speech:  Clear and Coherent and Normal Rate  Volume:  Normal  Mood:  Depressed  Affect:  Congruent  Thought Process:  Coherent and Goal Directed  Orientation:  Full (Time, Place, and Person)  Thought Content:  WDL  Suicidal Thoughts:  No  Homicidal Thoughts:  No  Memory:  Immediate;   Fair  Judgement:  Fair  Insight:  Fair  Psychomotor Activity:  Normal  Concentration:  Fair  Recall:  [employer] of Knowledge:Fair  Language: Fair  Akathisia:  No  Handed:  Right  AIMS (if indicated):    Assets:  Communication Skills Desire for Improvement Physical Health Social Support  ADL's:  Intact  Cognition: WNL  Sleep:      Allergies:   Allergies  Allergen Reactions  . Prednisone     Anxious.   Current Medications: Current Outpatient Prescriptions  Medication Sig Dispense Refill  . buPROPion (WELLBUTRIN) 75 MG tablet Take 1 tablet (75 mg total) by mouth daily. Pt has supply 30 tablet 2  . fluticasone (FLONASE) 50 MCG/ACT nasal spray Place into the nose.    . naproxen (NAPROSYN) 500 MG tablet Take 500 mg by mouth 2 (two) times daily with a meal.    . PARoxetine (PAXIL) 10 MG tablet Take 1 tablet (10 mg total) by mouth at bedtime. Take 1/2 tablet at bedtime 30 tablet 1  . sildenafil (VIAGRA) 100 MG tablet Take by mouth.    . tamsulosin (FLOMAX) 0.4 MG CAPS capsule Take 0.4 mg by mouth daily after breakfast.     No current facility-administered medications for this visit.    Previous Psychotropic Medications:  He stated that he has tried Klonopin in the past and was taking it for many years but his PCP has stopped it as he does not like to prescribe  Klonopin. 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 However he has stopped drinking since 1983.  Substance Abuse History in the last 12 months:  No.  Consequences of Substance Abuse: Negative NA  Medical Decision Making:  Review of Psycho-Social Stressors (1) and Review and summation of old records (2)  Treatment Plan Summary: Medication management   Continue Wellbutrin 75 mg in the morning He will continue on Paxil 10 mg at bedtime  He will follow up in 3 month or earlier depending on his symptoms   More than 50% of the time spent in psychoeducation, counseling and coordination of care.    This note was generated in part or whole with voice recognition software. Voice regonition is usually quite accurate but  there are transcription errors that can and very often do occur. I apologize for any typographical errors that were not detected and corrected.   Rainey Pines, MD    4/17/20171:54 PM

## 2015-08-04 ENCOUNTER — Other Ambulatory Visit: Payer: Self-pay | Admitting: Family Medicine

## 2015-08-04 DIAGNOSIS — Z136 Encounter for screening for cardiovascular disorders: Secondary | ICD-10-CM

## 2015-08-10 ENCOUNTER — Ambulatory Visit: Payer: Medicare Other

## 2015-10-17 ENCOUNTER — Ambulatory Visit (INDEPENDENT_AMBULATORY_CARE_PROVIDER_SITE_OTHER): Payer: Medicare Other | Admitting: Psychiatry

## 2015-10-17 ENCOUNTER — Encounter: Payer: Self-pay | Admitting: Psychiatry

## 2015-10-17 DIAGNOSIS — F330 Major depressive disorder, recurrent, mild: Secondary | ICD-10-CM

## 2015-10-17 MED ORDER — BUPROPION HCL 75 MG PO TABS: 75.0000 mg | ORAL_TABLET | Freq: Every day | ORAL | Status: DC

## 2015-10-17 MED ORDER — PAROXETINE HCL 10 MG PO TABS: 10.0000 mg | ORAL_TABLET | Freq: Every day | ORAL | Status: DC

## 2015-10-17 NOTE — Progress Notes (Signed)
Psychiatric MD Follow up NOTE  Patient Identification: Stuart Wise. MRN:  [id number] Date of Evaluation:  10/17/2015 Referral Source: ER- [facility] Chief Complaint:   Chief Complaint    Follow-up; Medication Refill     Visit Diagnosis:    ICD-9-CM ICD-10-CM   1. MDD (major depressive disorder), recurrent episode, moderate (HCC) 296.32 F33.1    Diagnosis:   Patient Active Problem List   Diagnosis Date Noted  . MDD (major depressive disorder), recurrent severe, without psychosis ([place]) [F33.2]   . 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 Arthritis, degenerative [M19.90] 06/22/2013  . Anxiety [F41.9] 12/06/2011  . 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 Colon polyp [K63.5] 12/06/2011  . 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 Hay fever [J30.1] 12/06/2011  . Blood in the urine [R31.9] 12/06/2011  . Major depressive episode [F32.9] 12/06/2011  . Prostate lump [N42.9] 12/06/2011  . Encounter for other specified special examinations [Z01.89] 12/06/2011   History of Present Illness:   Patient is a 65 year old male who Presented for follow-up appointment. He reported that he has has been doing well and Enjoying his time in the mountains with his wife and grandchildren. He just returned yesterday. He stated that he spends 1-2 weeks at a time. He is staying in the trailer park. He reported that he comes back home for 1-2 weeks at a time and then will return to the trailer park. Patient appeared calm and collected during the interview. He reported that the changes in the medication has been helping him and he has more energy. He stated that he sleeps well at night at his wife has reported that he is snoring at night. He currently denied having any side effects of the medications. He stated that his mood is improving. He denied having any perceptual disturbances.    Elements:  Severity:  mild. Associated Signs/Symptoms: Depression Symptoms:  doing  fine (Hypo) Manic Symptoms:  none Anxiety Symptoms:  none Psychotic Symptoms:  none PTSD Symptoms: Negative NA  Past Medical History:  Past Medical History  Diagnosis Date  . Depression   . Anxiety     Past Surgical History  Procedure Laterality Date  . Hernia repair     Family History:  Family History  Problem Relation Age of Onset  . Depression Mother   . Hypertension Mother   . Hyperlipidemia Mother   . Depression Father   . Hypertension Father   . Hyperlipidemia Father   . Depression Sister    Social History:   Social History   Social History  . Marital Status: Married    Spouse Name: N/A  . Number of Children: N/A  . Years of Education: N/A   Social History Main Topics  . Smoking status: Former Smoker    Types: Cigarettes  . Smokeless tobacco: Never Used  . Alcohol Use: No     Comment: rarely  . Drug Use: No  . Sexual Activity: No   Other Topics Concern  . None   Social History Narrative   Additional Social History:  He  is currently married for the past 43 years. He reported that one of his older son is deceased. His other son is a Engineer, structural in [place]. He has 3 grandchildren.  Musculoskeletal: Strength & Muscle Tone: within normal limits Gait & Station: normal Patient leans: N/A  Psychiatric Specialty Exam: HPI  ROS  Blood pressure 122/80, pulse 95, temperature 97.7 F (36.5 C), temperature source Tympanic, height  6' (1.829 m), weight 221 lb 6.4 oz (100.426 kg), SpO2 95 %.Body mass index is 30.02 kg/(m^2).  General Appearance: Casual  Eye Contact:  Fair  Speech:  Clear and Coherent and Normal Rate  Volume:  Normal  Mood:  Euthymic  Affect:  Congruent  Thought Process:  Coherent and Goal Directed  Orientation:  Full (Time, Place, and Person)  Thought Content:  WDL  Suicidal Thoughts:  No  Homicidal Thoughts:  No  Memory:  Immediate;   Fair  Judgement:  Fair  Insight:  Fair  Psychomotor Activity:  Normal  Concentration:   Fair  Recall:  [employer] of Knowledge:Fair  Language: Fair  Akathisia:  No  Handed:  Right  AIMS (if indicated):    Assets:  Communication Skills Desire for Improvement Physical Health Social Support  ADL's:  Intact  Cognition: WNL  Sleep:      Allergies:   Allergies  Allergen Reactions  . Prednisone     Anxious.   Current Medications: Current Outpatient Prescriptions  Medication Sig Dispense Refill  . buPROPion (WELLBUTRIN) 75 MG tablet Take 1 tablet (75 mg total) by mouth daily. Pt has supply 90 tablet 1  . fluticasone (FLONASE) 50 MCG/ACT nasal spray Place into the nose.    . naproxen (NAPROSYN) 500 MG tablet Take 500 mg by mouth 2 (two) times daily with a meal.    . PARoxetine (PAXIL) 10 MG tablet Take 1 tablet (10 mg total) by mouth at bedtime. 90 tablet 1  . sildenafil (VIAGRA) 100 MG tablet Take by mouth.    . tamsulosin (FLOMAX) 0.4 MG CAPS capsule Take 0.4 mg by mouth daily after breakfast.     No current facility-administered medications for this visit.    Previous Psychotropic Medications:  He stated that he has tried Klonopin in the past and was taking it for many years but his PCP has stopped it as he does not like to prescribe Klonopin. 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 However he has stopped drinking since 1983.  Substance Abuse History in the last 12 months:  No.  Consequences of Substance Abuse: Negative NA  Medical Decision Making:  Review of Psycho-Social Stressors (1) and Review and summation of old records (2)  Treatment Plan Summary: Medication management   Continue Wellbutrin 75 mg in the morning He will continue on Paxil 10 mg at bedtime  He will follow up in 3 month or earlier depending on his symptoms   More than 50% of the time spent in psychoeducation, counseling and coordination of care.    This note was generated in part or whole with voice recognition  software. Voice regonition is usually quite accurate but there are transcription errors that can and very often do occur. I apologize for any typographical errors that were not detected and corrected.   Rainey Pines, MD    7/17/20171:07 PM

## 2016-03-20 ENCOUNTER — Other Ambulatory Visit: Payer: Self-pay | Admitting: Psychiatry

## 2016-06-22 DIAGNOSIS — K5900 Constipation, unspecified: Secondary | ICD-10-CM | POA: Insufficient documentation

## 2016-06-22 DIAGNOSIS — Z86010 Personal history of colonic polyps: Secondary | ICD-10-CM | POA: Insufficient documentation

## 2016-06-22 DIAGNOSIS — Z860101 Personal history of adenomatous and serrated colon polyps: Secondary | ICD-10-CM | POA: Insufficient documentation

## 2016-07-24 ENCOUNTER — Ambulatory Visit: Payer: Medicare Other | Admitting: Psychiatry

## 2016-09-04 ENCOUNTER — Encounter: Payer: Self-pay | Admitting: *Deleted

## 2016-09-05 ENCOUNTER — Ambulatory Visit: Payer: Medicare Other | Admitting: Anesthesiology

## 2016-09-05 ENCOUNTER — Encounter
Admission: RE | Disposition: A | Discharge status: Home / Self Care | Payer: Self-pay | Source: Ambulatory Visit | Attending: Unknown Physician Specialty

## 2016-09-05 ENCOUNTER — Ambulatory Visit
Admission: RE | Admit: 2016-09-05 | Discharge: 2016-09-05 | Disposition: A | Discharge status: Home / Self Care | Payer: Medicare Other | Source: Ambulatory Visit | Attending: Unknown Physician Specialty | Admitting: Unknown Physician Specialty

## 2016-09-05 DIAGNOSIS — Z8249 Family history of ischemic heart disease and other diseases of the circulatory system: Secondary | ICD-10-CM | POA: Insufficient documentation

## 2016-09-05 DIAGNOSIS — Z818 Family history of other mental and behavioral disorders: Secondary | ICD-10-CM | POA: Insufficient documentation

## 2016-09-05 DIAGNOSIS — D125 Benign neoplasm of sigmoid colon: Secondary | ICD-10-CM | POA: Insufficient documentation

## 2016-09-05 DIAGNOSIS — M1990 Unspecified osteoarthritis, unspecified site: Secondary | ICD-10-CM | POA: Insufficient documentation

## 2016-09-05 DIAGNOSIS — J301 Allergic rhinitis due to pollen: Secondary | ICD-10-CM | POA: Diagnosis not present

## 2016-09-05 DIAGNOSIS — K640 First degree hemorrhoids: Secondary | ICD-10-CM | POA: Diagnosis not present

## 2016-09-05 DIAGNOSIS — Z79899 Other long term (current) drug therapy: Secondary | ICD-10-CM | POA: Insufficient documentation

## 2016-09-05 DIAGNOSIS — N400 Enlarged prostate without lower urinary tract symptoms: Secondary | ICD-10-CM | POA: Insufficient documentation

## 2016-09-05 DIAGNOSIS — F329 Major depressive disorder, single episode, unspecified: Secondary | ICD-10-CM | POA: Diagnosis not present

## 2016-09-05 DIAGNOSIS — F419 Anxiety disorder, unspecified: Secondary | ICD-10-CM | POA: Insufficient documentation

## 2016-09-05 DIAGNOSIS — K5730 Diverticulosis of large intestine without perforation or abscess without bleeding: Secondary | ICD-10-CM | POA: Diagnosis not present

## 2016-09-05 DIAGNOSIS — Z87891 Personal history of nicotine dependence: Secondary | ICD-10-CM | POA: Insufficient documentation

## 2016-09-05 DIAGNOSIS — Z1211 Encounter for screening for malignant neoplasm of colon: Secondary | ICD-10-CM | POA: Diagnosis not present

## 2016-09-05 DIAGNOSIS — K529 Noninfective gastroenteritis and colitis, unspecified: Secondary | ICD-10-CM | POA: Insufficient documentation

## 2016-09-05 DIAGNOSIS — Z86010 Personal history of colonic polyps: Secondary | ICD-10-CM | POA: Insufficient documentation

## 2016-09-05 HISTORY — DX: Benign prostatic hyperplasia without lower urinary tract symptoms: N40.0

## 2016-09-05 HISTORY — DX: Unspecified osteoarthritis, unspecified site: M19.90

## 2016-09-05 HISTORY — DX: Allergic rhinitis due to pollen: J30.1

## 2016-09-05 HISTORY — DX: Polyp of colon: K63.5

## 2016-09-05 HISTORY — DX: Male erectile dysfunction, unspecified: N52.9

## 2016-09-05 HISTORY — PX: COLONOSCOPY WITH PROPOFOL: SHX5780

## 2016-09-05 HISTORY — DX: Personal history of colonic polyps: Z86.010

## 2016-09-05 HISTORY — DX: Other specified diseases of pancreas: K86.89

## 2016-09-05 HISTORY — DX: Encounter for screening for malignant neoplasm of colon: Z12.11

## 2016-09-05 SURGERY — COLONOSCOPY WITH PROPOFOL
Anesthesia: General

## 2016-09-05 MED ORDER — PROPOFOL 500 MG/50ML IV EMUL
INTRAVENOUS | Status: AC
  Filled 2016-09-05: qty 50

## 2016-09-05 MED ORDER — SODIUM CHLORIDE 0.9 % IV SOLN
INTRAVENOUS | Status: DC
  Administered 2016-09-05: 11:00:00 via INTRAVENOUS
  Administered 2016-09-05: 1000 mL via INTRAVENOUS

## 2016-09-05 MED ORDER — PROPOFOL 500 MG/50ML IV EMUL
INTRAVENOUS | Status: DC | PRN
  Administered 2016-09-05: 150 ug/kg/min via INTRAVENOUS

## 2016-09-05 MED ORDER — FENTANYL CITRATE (PF) 100 MCG/2ML IJ SOLN
INTRAMUSCULAR | Status: DC | PRN
  Administered 2016-09-05: 50 ug via INTRAVENOUS

## 2016-09-05 MED ORDER — FENTANYL CITRATE (PF) 100 MCG/2ML IJ SOLN
INTRAMUSCULAR | Status: AC
  Filled 2016-09-05: qty 2

## 2016-09-05 MED ORDER — MIDAZOLAM HCL 2 MG/2ML IJ SOLN
INTRAMUSCULAR | Status: AC
  Filled 2016-09-05: qty 2

## 2016-09-05 MED ORDER — SODIUM CHLORIDE 0.9 % IV SOLN: INTRAVENOUS | Status: DC

## 2016-09-05 MED ORDER — PROPOFOL 10 MG/ML IV BOLUS
INTRAVENOUS | Status: DC | PRN
  Administered 2016-09-05: 50 mg via INTRAVENOUS

## 2016-09-05 MED ORDER — EPHEDRINE SULFATE 50 MG/ML IJ SOLN
INTRAMUSCULAR | Status: DC | PRN
  Administered 2016-09-05: 10 mg via INTRAVENOUS

## 2016-09-05 MED ORDER — LIDOCAINE HCL (PF) 2 % IJ SOLN
INTRAMUSCULAR | Status: AC
  Filled 2016-09-05: qty 2

## 2016-09-05 MED ORDER — MIDAZOLAM HCL 2 MG/2ML IJ SOLN
INTRAMUSCULAR | Status: DC | PRN
  Administered 2016-09-05 (×2): 1 mg via INTRAVENOUS

## 2016-09-05 NOTE — Anesthesia Preprocedure Evaluation (Signed)
Anesthesia Evaluation  Patient identified by MRN, date of birth, ID band Patient awake    Reviewed: Allergy & Precautions, H&P , NPO status , Patient's Chart, lab work & pertinent test results, reviewed documented beta blocker date and time   Airway Mallampati: II   Neck ROM: full    Dental  (+) Poor Dentition, Teeth Intact   Pulmonary neg pulmonary ROS, former smoker,    Pulmonary exam normal        Cardiovascular negative cardio ROS Normal cardiovascular exam Rhythm:regular Rate:Normal     Neuro/Psych PSYCHIATRIC DISORDERS negative neurological ROS  negative psych ROS   GI/Hepatic negative GI ROS, Neg liver ROS,   Endo/Other  negative endocrine ROS  Renal/GU negative Renal ROS  negative genitourinary   Musculoskeletal   Abdominal   Peds  Hematology negative hematology ROS (+)   Anesthesia Other Findings Past Medical History: No date: Anxiety No date: Arthritis     Comment: osteoarthritis No date: BPH (benign prostatic hyperplasia) No date: Colon polyps No date: Depression No date: Encounter for colonoscopy due to history of ad* No date: Erectile dysfunction No date: Hay fever No date: Mass of pancreas Past Surgical History: No date: COLONOSCOPY No date: HERNIA REPAIR BMI    Body Mass Index:  30.65 kg/m     Reproductive/Obstetrics negative OB ROS                             Anesthesia Physical Anesthesia Plan  ASA: II  Anesthesia Plan: General   Post-op Pain Management:    Induction:   PONV Risk Score and Plan:   Airway Management Planned:   Additional Equipment:   Intra-op Plan:   Post-operative Plan:   Informed Consent: I have reviewed the patients History and Physical, chart, labs and discussed the procedure including the risks, benefits and alternatives for the proposed anesthesia with the patient or authorized representative who has indicated his/her  understanding and acceptance.   Dental Advisory Given  Plan Discussed with: CRNA  Anesthesia Plan Comments:         Anesthesia Quick Evaluation

## 2016-09-05 NOTE — Op Note (Signed)
[facility] Patient Name: Stuart Wise Procedure Date: 09/05/2016 11:14 AM MRN: [id number] Account #: [id number] Date of Birth: 1950/07/01 Admit Type: Outpatient Age: 66 Room: [facility] ENDO ROOM 4 Gender: Male Note Status: Finalized Procedure:            Colonoscopy Indications:          High risk colon cancer surveillance: Personal history                        of colonic polyps Providers:            Manya Silvas, MD Referring MD:         Wynona Canes. Kym Groom, MD (Referring MD) Medicines:            Propofol per Anesthesia Complications:        No immediate complications. Procedure:            Pre-Anesthesia Assessment:                       - After reviewing the risks and benefits, the patient                        was deemed in satisfactory condition to undergo the                        procedure.                       After obtaining informed consent, the colonoscope was                        passed under direct vision. Throughout the procedure,                        the patient's blood pressure, pulse, and oxygen                        saturations were monitored continuously. The                        Colonoscope was introduced through the anus and                        advanced to the the cecum, identified by appendiceal                        orifice and ileocecal valve. The colonoscopy was                        performed without difficulty. The patient tolerated the                        procedure well. The quality of the bowel preparation                        was good. Findings:      A diminutive polyp was found in the sigmoid colon. The polyp was       sessile. The polyp was removed with a jumbo cold forceps. Resection and       retrieval were complete.      A few small-mouthed diverticula  were found in the sigmoid colon and       hepatic flexure.      Internal hemorrhoids were found during endoscopy. The hemorrhoids were       small and Grade I (internal hemorrhoids that do not prolapse).      The exam was otherwise without abnormality. Impression:           - One diminutive polyp in the sigmoid colon, removed                        with a jumbo cold forceps. Resected and retrieved.                       - Diverticulosis in the sigmoid colon and at the                        hepatic flexure.                       - Internal hemorrhoids.                       - The examination was otherwise normal. Recommendation:       - Await pathology results. Manya Silvas, MD 09/05/2016 12:07:25 PM This report has been signed electronically. Number of Addenda: 0 Note Initiated On: 09/05/2016 11:14 AM      [facility]

## 2016-09-05 NOTE — Transfer of Care (Signed)
Immediate Anesthesia Transfer of Care Note  Patient: Stuart Wise.  Procedure(s) Performed: Procedure(s): COLONOSCOPY WITH PROPOFOL (N/A)  Patient Location: PACU  Anesthesia Type:General  Level of Consciousness: awake  Airway & Oxygen Therapy: Patient Spontanous Breathing and Patient connected to nasal cannula oxygen  Post-op Assessment: Report given to RN and Post -op Vital signs reviewed and stable  Post vital signs: Reviewed and stable  Last Vitals:  Vitals:   09/05/16 0942  BP: (!) 162/92  Pulse: 76  Resp: 16  Temp: 36.6 C    Last Pain:  Vitals:   09/05/16 0942  TempSrc: Tympanic         Complications: No apparent anesthesia complications

## 2016-09-05 NOTE — Anesthesia Post-op Follow-up Note (Cosign Needed)
Anesthesia QCDR form completed.        

## 2016-09-05 NOTE — Anesthesia Procedure Notes (Signed)
Date/Time: 09/05/2016 11:30 AM Performed by: Allean Found Pre-anesthesia Checklist: Patient identified, Emergency Drugs available, Suction available, Patient being monitored and Timeout performed Patient Re-evaluated:Patient Re-evaluated prior to inductionOxygen Delivery Method: Nasal cannula Intubation Type: IV induction Placement Confirmation: positive ETCO2

## 2016-09-06 ENCOUNTER — Encounter: Payer: Self-pay | Admitting: Unknown Physician Specialty

## 2016-09-07 LAB — SURGICAL PATHOLOGY

## 2016-09-07 MED ORDER — SODIUM CHLORIDE 0.9 % IV SOLN: INTRAVENOUS | Status: AC

## 2016-09-07 NOTE — Anesthesia Postprocedure Evaluation (Signed)
Anesthesia Post Note  Patient: Stuart Wise.  Procedure(s) Performed: Procedure(s) (LRB): COLONOSCOPY WITH PROPOFOL (N/A)  Patient location during evaluation: PACU Anesthesia Type: General Level of consciousness: awake and alert Pain management: pain level controlled Vital Signs Assessment: post-procedure vital signs reviewed and stable Respiratory status: spontaneous breathing, nonlabored ventilation, respiratory function stable and patient connected to nasal cannula oxygen Cardiovascular status: blood pressure returned to baseline and stable Postop Assessment: no signs of nausea or vomiting Anesthetic complications: no     Last Vitals:  Vitals:   09/05/16 1228 09/05/16 1238  BP: 124/72 128/76  Pulse: 78 70  Resp: 17 18  Temp:      Last Pain:  Vitals:   09/05/16 1208  TempSrc: Tympanic                 Molli Barrows

## 2016-09-07 NOTE — H&P (Signed)
Primary Care Physician:  Valera Castle, MD Primary Gastroenterologist:  Dr. Vira Agar  Pre-Procedure History & Physical: HPI:  Stuart Wise. is a 66 y.o. male is here for an colonoscopy.   Past Medical History:  Diagnosis Date  . Anxiety   . Arthritis    osteoarthritis  . BPH (benign prostatic hyperplasia)   . Colon polyps   . Depression   . Encounter for colonoscopy due to history of adenomatous colonic polyps   . Erectile dysfunction   . Hay fever   . Mass of pancreas     Past Surgical History:  Procedure Laterality Date  . COLONOSCOPY    . COLONOSCOPY WITH PROPOFOL N/A 09/05/2016   Procedure: COLONOSCOPY WITH PROPOFOL;  Surgeon: Manya Silvas, MD;  Location: [facility] ENDOSCOPY;  Service: Endoscopy;  Laterality: N/A;  . HERNIA REPAIR      Prior to Admission medications   Medication Sig Start Date End Date Taking? Authorizing Provider  buPROPion (WELLBUTRIN) 75 MG tablet Take 1 tablet (75 mg total) by mouth daily. Pt has supply 10/17/15 10/16/16 Yes Rainey Pines, MD  finasteride (PROSCAR) 5 MG tablet Take 5 mg by mouth daily.   Yes [provider]  naproxen (NAPROSYN) 500 MG tablet Take 500 mg by mouth 2 (two) times daily with a meal.   Yes [provider]  PARoxetine (PAXIL) 10 MG tablet Take 1 tablet (10 mg total) by mouth at bedtime. 10/17/15  Yes Rainey Pines, MD  sildenafil (REVATIO) 20 MG tablet Take 20 mg by mouth 3 (three) times daily.   Yes [provider]  sildenafil (VIAGRA) 100 MG tablet Take by mouth. 02/15/15  Yes [provider]  tamsulosin (FLOMAX) 0.4 MG CAPS capsule Take 0.4 mg by mouth daily after breakfast.   Yes [provider]  traZODone (DESYREL) 50 MG tablet Take 50 mg by mouth at bedtime.   Yes [provider]  fluticasone (FLONASE) 50 MCG/ACT nasal spray Place into the nose. 02/15/15 02/15/16  [provider]  sulfamethoxazole-trimethoprim (BACTRIM DS,SEPTRA DS) 800-160 MG  tablet Take 1 tablet by mouth 2 (two) times daily.    [provider]    Allergies as of 07/13/2016 - Review Complete 10/17/2015  Allergen Reaction Noted  . Prednisone  05/08/2015    Family History  Problem Relation Age of Onset  . Depression Mother   . Hypertension Mother   . Hyperlipidemia Mother   . Depression Father   . Hypertension Father   . Hyperlipidemia Father   . Depression Sister     Social History   Social History  . Marital status: Married    Spouse name: N/A  . Number of children: N/A  . Years of education: N/A   Occupational History  . Not on file.   Social History Main Topics  . Smoking status: Former Smoker    Types: Cigarettes  . Smokeless tobacco: Never Used  . Alcohol use No     Comment: rarely  . Drug use: No  . Sexual activity: No   Other Topics Concern  . Not on file   Social History Narrative  . No narrative on file    Review of Systems: See HPI, otherwise negative ROS  Physical Exam: BP 128/76   Pulse 70   Temp 97 F (36.1 C) (Tympanic)   Resp 18   Ht 6' (1.829 m)   Wt 102.5 kg (226 lb)   SpO2 100%   BMI 30.65 kg/m  General:  Alert,  pleasant and cooperative in NAD Head:  Normocephalic and atraumatic. Neck:  Supple; no masses or thyromegaly. Lungs:  Clear throughout to auscultation.    Heart:  Regular rate and rhythm. Abdomen:  Soft, nontender and nondistended. Normal bowel sounds, without guarding, and without rebound.   Neurologic:  Alert and  oriented x4;  grossly normal neurologically.  Impression/Plan: Stuart Wise. is here for an colonoscopy to be performed for Personal history of colon polyps  Risks, benefits, limitations, and alternatives regarding  colonoscopy have been reviewed with the patient.  Questions have been answered.  All parties agreeable.   Gaylyn Cheers, MD  09/07/2016, 11:19 AM

## 2019-05-24 ENCOUNTER — Ambulatory Visit: Payer: Self-pay

## 2019-05-24 ENCOUNTER — Ambulatory Visit: Payer: Medicare Other | Attending: Internal Medicine

## 2019-05-24 ENCOUNTER — Other Ambulatory Visit: Payer: Self-pay

## 2019-05-24 ENCOUNTER — Ambulatory Visit: Payer: Medicare Other

## 2019-05-24 DIAGNOSIS — Z23 Encounter for immunization: Secondary | ICD-10-CM | POA: Insufficient documentation

## 2019-05-24 NOTE — Progress Notes (Signed)
   [id number]  Name:  Stuart Wise.    MRN: [id number] DOB: 02/07/1951  05/24/2019  Stuart Wise was observed post Covid-19 immunization for 15 minutes without incidence. He was provided with Vaccine Information Sheet and instruction to access the V-Safe system.   Stuart Wise was instructed to call 911 with any severe reactions post vaccine: Marland Kitchen Difficulty breathing  . Swelling of your face and throat  . A fast heartbeat  . A bad rash all over your body  . Dizziness and weakness    Immunizations Administered    Name Date Dose VIS Date Route   Pfizer COVID-19 Vaccine 05/24/2019  3:23 PM 0.3 mL 03/13/2019 Intramuscular   Manufacturer: [place]   Lot: [id number]   [place]: [id number]

## 2019-06-17 ENCOUNTER — Ambulatory Visit: Payer: Medicare Other | Attending: Internal Medicine

## 2019-06-17 DIAGNOSIS — Z23 Encounter for immunization: Secondary | ICD-10-CM

## 2019-06-17 NOTE — Progress Notes (Signed)
   [id number] Vaccination [facility]  Name:  Stuart Wise.    MRN: [id number] DOB: Nov 19, 1950  06/17/2019  Stuart Wise was observed post Covid-19 immunization for 15 minutes without incident. He was provided with Vaccine Information Sheet and instruction to access the V-Safe system.   Stuart Wise was instructed to call 911 with any severe reactions post vaccine: Marland Kitchen Difficulty breathing  . Swelling of face and throat  . A fast heartbeat  . A bad rash all over body  . Dizziness and weakness   Immunizations Administered    Name Date Dose VIS Date Route   Pfizer COVID-19 Vaccine 06/17/2019 12:51 PM 0.3 mL 03/13/2019 Intramuscular   Manufacturer: [place]   Lot: [id number]   [place]: [id number]

## 2019-10-22 ENCOUNTER — Encounter: Payer: Self-pay | Admitting: *Deleted

## 2019-10-22 ENCOUNTER — Telehealth: Payer: Self-pay | Admitting: *Deleted

## 2019-10-22 DIAGNOSIS — Z122 Encounter for screening for malignant neoplasm of respiratory organs: Secondary | ICD-10-CM

## 2019-10-22 DIAGNOSIS — Z87891 Personal history of nicotine dependence: Secondary | ICD-10-CM

## 2019-10-22 NOTE — Telephone Encounter (Signed)
Attempted to contact to schedule lung screening scan. However there is no answer or voicemail option. Will send mychart message.

## 2019-10-28 NOTE — Telephone Encounter (Signed)
Received referral for initial lung cancer screening scan. Contacted patient and obtained smoking history,(former, quit 2007, 30 pack year) as well as answering questions related to screening process. Patient denies signs of lung cancer such as weight loss or hemoptysis. Patient denies comorbidity that would prevent curative treatment if lung cancer were found. Patient is scheduled for shared decision making visit and CT scan on 11/10/19 at 145pm.

## 2019-11-10 ENCOUNTER — Ambulatory Visit
Admission: RE | Admit: 2019-11-10 | Discharge: 2019-11-10 | Disposition: A | Discharge status: Home / Self Care | Payer: Medicare Other | Source: Ambulatory Visit | Attending: Oncology | Admitting: Oncology

## 2019-11-10 ENCOUNTER — Encounter: Payer: Self-pay | Admitting: Hospice and Palliative Medicine

## 2019-11-10 ENCOUNTER — Inpatient Hospital Stay: Payer: Medicare Other | Attending: Oncology | Admitting: Hospice and Palliative Medicine

## 2019-11-10 ENCOUNTER — Other Ambulatory Visit: Payer: Self-pay

## 2019-11-10 DIAGNOSIS — Z87891 Personal history of nicotine dependence: Secondary | ICD-10-CM

## 2019-11-10 DIAGNOSIS — Z122 Encounter for screening for malignant neoplasm of respiratory organs: Secondary | ICD-10-CM

## 2019-11-10 NOTE — Progress Notes (Signed)
Virtual Visit via Video Note  I connected with@ on 11/10/19 at@ by a video enabled telemedicine application and verified that I am speaking with the correct person using two identifiers.   I discussed the limitations of evaluation and management by telemedicine and the availability of in person appointments. The patient expressed understanding and agreed to proceed.  In accordance with CMS guidelines, patient has met eligibility criteria including age, absence of signs or symptoms of lung cancer.  Social History   Tobacco Use  . Smoking status: Former Smoker    Packs/day: 1.00    Years: 30.00    Pack years: 30.00    Types: Cigarettes    Quit date: 2007    Years since quitting: 14.6  . Smokeless tobacco: Never Used  Substance Use Topics  . Alcohol use: No    Comment: rarely  . Drug use: No      A shared decision-making session was conducted prior to the performance of CT scan. This includes one or more decision aids, includes benefits and harms of screening, follow-up diagnostic testing, over-diagnosis, false positive rate, and total radiation exposure.   Counseling on the importance of adherence to annual lung cancer LDCT screening, impact of co-morbidities, and ability or willingness to undergo diagnosis and treatment is imperative for compliance of the program.   Counseling on the importance of continued smoking cessation for former smokers; the importance of smoking cessation for current smokers, and information about tobacco cessation interventions have been given to patient including [place] and [place] quit [place] programs.   Written order for lung cancer screening with LDCT has been given to the patient and any and all questions have been answered to the best of my abilities.    Yearly follow up will be coordinated by Burgess Estelle, Thoracic Navigator.  Time Total: 15 minutes  Visit consisted of counseling and education dealing with complex health screening.  Greater than 50%  of this time was spent counseling and coordinating care related to the above assessment and plan.  Signed by: Altha Harm, PhD, NP-C

## 2019-11-16 ENCOUNTER — Encounter: Payer: Self-pay | Admitting: *Deleted

## 2020-11-29 ENCOUNTER — Ambulatory Visit: Payer: Self-pay | Admitting: Surgery

## 2020-11-29 NOTE — H&P (View-Only) (Signed)
Subjective:   CC: Non-recurrent unilateral inguinal hernia without obstruction or gangrene [K40.90]  HPI:  Stuart Wise is a 70 y.o. male who was referred by Valera Castle, MD for evaluation of above. Symptoms were first noted several weeks ago. Pain is dull and intermittent, confined to the right groin, without radiation.  Associated with nothing specific, exacerbated by exertion  Lump is reducible.    Past Medical History:  has a past medical history of Allergic state, Anxiety, BPH (benign prostatic hyperplasia), Colon polyps, normal colonoscopy 2013, repeat due 2018 (12/06/2011), Depression, ED (erectile dysfunction) (12/06/2011), Hay fever, seasonal spring (12/06/2011), adenomatous colonic polyps (06/22/2016), Major depressive disorder with single episode, in remission (CMS-HCC) (12/06/2011), Mass of pancreas (04/15/2014), Osteoarthritis (06/22/2013), Prostate nodule, neg biopsy 2011 (12/06/2011), and Tests: Sleep study 2011 WNL, Cystoscopy 1995 WNL, (12/06/2011).  Past Surgical History:  Past Surgical History:  Procedure Laterality Date   COLONOSCOPY  10/26/2002   Int Hemorrhoids   COLONOSCOPY  02/07/2010, 12/26/2004   Adenomatous Polyp   COLONOSCOPY  05/15/2011   PH Adenomatous Polyps: CBF 05/2016; Recall Ltr mailed 03/23/2016 (dw); OV made 06/22/2016 @ 10am w/Kim Jerelene Redden NP (dw)   COLONOSCOPY  09/05/2016   PH Adenomatous Polyps: CBF 08/2021   HERNIA REPAIR  1994   left inguinal herniorrhaphy   prostate bx      Family History: family history includes Coronary Artery Disease (Blocked arteries around heart) in his father; Depression in his son.  Social History:  reports that he quit smoking about 14 years ago. His smoking use included cigarettes. He has a 37.50 pack-year smoking history. He has never used smokeless tobacco. He reports current alcohol use. He reports that he does not use drugs.  Current Medications: has a current medication list which includes the following prescription(s):  atorvastatin, sildenafil, and tamsulosin.  Allergies:  Allergies as of 11/29/2020 - Reviewed 11/29/2020  Allergen Reaction Noted   Prednisone Unknown 12/25/2013    ROS:  A 15 point review of systems was performed and pertinent positives and negatives noted in HPI   Objective:     BP (!) 147/91   Pulse 86   Ht 181.9 cm (5' 11.6")   Wt 99.3 kg (219 lb)   BMI 30.03 kg/m   Constitutional :  alert, appears stated age, cooperative and no distress  Lymphatics/Throat:  no asymmetry, masses, or scars  Respiratory:  clear to auscultation bilaterally  Cardiovascular:  regular rate and rhythm  Gastrointestinal: soft, non-tender; bowel sounds normal; no masses,  no organomegaly. inguinal hernia noted.  large, no overlying skin changes and RIGHT, with bowel involvement  Musculoskeletal: Steady gait and movement  Skin: Cool and moist, no visible surgical scars   Psychiatric: Normal affect, non-agitated, not confused       LABS:  n/a   RADS: n/a Assessment:       Non-recurrent unilateral inguinal hernia without obstruction or gangrene [K40.90], RIGHT  Plan:     1. Non-recurrent unilateral inguinal hernia without obstruction or gangrene [K40.90]   Discussed the risk of surgery including recurrence, which can be up to 50% in the case of incisional or complex hernias, possible use of prosthetic materials (mesh) and the increased risk of mesh infxn if used, bleeding, chronic pain, post-op infxn, post-op SBO or ileus, and possible re-operation to address said risks. The risks of general anesthetic, if used, includes MI, CVA, sudden death or even reaction to anesthetic medications also discussed. Alternatives include continued observation.  Benefits include possible symptom relief, prevention of  incarceration, strangulation, enlargement in size over time, and the risk of emergency surgery in the face of strangulation.   Typical post-op recovery time of 3-5 days with 2 weeks of activity  restrictions were also discussed.  ED return precautions given for sudden increase in pain, size of hernia with accompanying fever, nausea, and/or vomiting.  The patient verbalized understanding and all questions were answered to the patient's satisfaction.   2. Patient has elected to proceed with surgical treatment. Procedure will be scheduled.  RIGHT, robotic assisted laparoscopic

## 2020-11-29 NOTE — H&P (Signed)
Subjective:   CC: Non-recurrent unilateral inguinal hernia without obstruction or gangrene [K40.90]  HPI:  Stuart Wise is a 70 y.o. male who was referred by Valera Castle, MD for evaluation of above. Symptoms were first noted several weeks ago. Pain is dull and intermittent, confined to the right groin, without radiation.  Associated with nothing specific, exacerbated by exertion  Lump is reducible.    Past Medical History:  has a past medical history of Allergic state, Anxiety, BPH (benign prostatic hyperplasia), Colon polyps, normal colonoscopy 2013, repeat due 2018 (12/06/2011), Depression, ED (erectile dysfunction) (12/06/2011), Hay fever, seasonal spring (12/06/2011), adenomatous colonic polyps (06/22/2016), Major depressive disorder with single episode, in remission (CMS-HCC) (12/06/2011), Mass of pancreas (04/15/2014), Osteoarthritis (06/22/2013), Prostate nodule, neg biopsy 2011 (12/06/2011), and Tests: Sleep study 2011 WNL, Cystoscopy 1995 WNL, (12/06/2011).  Past Surgical History:  Past Surgical History:  Procedure Laterality Date   COLONOSCOPY  10/26/2002   Int Hemorrhoids   COLONOSCOPY  02/07/2010, 12/26/2004   Adenomatous Polyp   COLONOSCOPY  05/15/2011   PH Adenomatous Polyps: CBF 05/2016; Recall Ltr mailed 03/23/2016 (dw); OV made 06/22/2016 @ 10am w/Kim Jerelene Redden NP (dw)   COLONOSCOPY  09/05/2016   PH Adenomatous Polyps: CBF 08/2021   HERNIA REPAIR  1994   left inguinal herniorrhaphy   prostate bx      Family History: family history includes Coronary Artery Disease (Blocked arteries around heart) in his father; Depression in his son.  Social History:  reports that he quit smoking about 14 years ago. His smoking use included cigarettes. He has a 37.50 pack-year smoking history. He has never used smokeless tobacco. He reports current alcohol use. He reports that he does not use drugs.  Current Medications: has a current medication list which includes the following prescription(s):  atorvastatin, sildenafil, and tamsulosin.  Allergies:  Allergies as of 11/29/2020 - Reviewed 11/29/2020  Allergen Reaction Noted   Prednisone Unknown 12/25/2013    ROS:  A 15 point review of systems was performed and pertinent positives and negatives noted in HPI   Objective:     BP (!) 147/91   Pulse 86   Ht 181.9 cm (5' 11.6")   Wt 99.3 kg (219 lb)   BMI 30.03 kg/m   Constitutional :  alert, appears stated age, cooperative and no distress  Lymphatics/Throat:  no asymmetry, masses, or scars  Respiratory:  clear to auscultation bilaterally  Cardiovascular:  regular rate and rhythm  Gastrointestinal: soft, non-tender; bowel sounds normal; no masses,  no organomegaly. inguinal hernia noted.  large, no overlying skin changes and RIGHT, with bowel involvement  Musculoskeletal: Steady gait and movement  Skin: Cool and moist, no visible surgical scars   Psychiatric: Normal affect, non-agitated, not confused       LABS:  n/a   RADS: n/a Assessment:       Non-recurrent unilateral inguinal hernia without obstruction or gangrene [K40.90], RIGHT  Plan:     1. Non-recurrent unilateral inguinal hernia without obstruction or gangrene [K40.90]   Discussed the risk of surgery including recurrence, which can be up to 50% in the case of incisional or complex hernias, possible use of prosthetic materials (mesh) and the increased risk of mesh infxn if used, bleeding, chronic pain, post-op infxn, post-op SBO or ileus, and possible re-operation to address said risks. The risks of general anesthetic, if used, includes MI, CVA, sudden death or even reaction to anesthetic medications also discussed. Alternatives include continued observation.  Benefits include possible symptom relief, prevention of  incarceration, strangulation, enlargement in size over time, and the risk of emergency surgery in the face of strangulation.   Typical post-op recovery time of 3-5 days with 2 weeks of activity  restrictions were also discussed.  ED return precautions given for sudden increase in pain, size of hernia with accompanying fever, nausea, and/or vomiting.  The patient verbalized understanding and all questions were answered to the patient's satisfaction.   2. Patient has elected to proceed with surgical treatment. Procedure will be scheduled.  RIGHT, robotic assisted laparoscopic

## 2020-12-07 ENCOUNTER — Inpatient Hospital Stay: Admission: RE | Admit: 2020-12-07 | Payer: Medicare Other | Source: Ambulatory Visit

## 2020-12-08 ENCOUNTER — Other Ambulatory Visit: Payer: Self-pay

## 2020-12-08 ENCOUNTER — Encounter
Admission: RE | Admit: 2020-12-08 | Discharge: 2020-12-08 | Disposition: A | Discharge status: Home / Self Care | Payer: Medicare Other | Source: Ambulatory Visit | Attending: Surgery | Admitting: Surgery

## 2020-12-08 NOTE — Patient Instructions (Addendum)
Your procedure is scheduled on:12-15-20 Thursday Report to the Registration Desk on the 1st floor of the [facility].Then proceed to the 2nd floor Surgery Desk in the [facility] To find out your arrival time, please call [phone] between 1PM - 3PM on:12-14-20 Wednesday  REMEMBER: Instructions that are not followed completely may result in serious medical risk, up to and including death; or upon the discretion of your surgeon and anesthesiologist your surgery may need to be rescheduled.  Do not eat food after midnight the night before surgery.  No gum chewing, lozengers or hard candies.  You may however, drink CLEAR liquids up to 2 hours before you are scheduled to arrive for your surgery. Do not drink anything within 2 hours of your scheduled arrival time.  Clear liquids include: - water  - apple juice without pulp - gatorade (not RED, PURPLE, OR BLUE) - black coffee or tea (Do NOT add milk or creamers to the coffee or tea) Do NOT drink anything that is not on this list.  TAKE THESE MEDICATIONS THE MORNING OF SURGERY WITH A SIP OF WATER: -Loratadine (Claritin) -Flomax (Tamsulosin)  One week prior to surgery: Stop Anti-inflammatories (NSAIDS) such as Advil, Aleve, Ibuprofen, Motrin, Naproxen, Naprosyn and Aspirin based products such as Excedrin, Goodys Powder, BC Powder.You may however, continue to take Tylenol if needed for pain up until the day of surgery.  Stop ANY OVER THE COUNTER supplements/vitamins NOW (12-08-20)until after surgery (Melatonin and multivitamin)  No Alcohol for 24 hours before or after surgery.  No Smoking including e-cigarettes for 24 hours prior to surgery.  No chewable tobacco products for at least 6 hours prior to surgery.  No nicotine patches on the day of surgery.  Do not use any "recreational" drugs for at least a week prior to your surgery.  Please be advised that the combination of cocaine and anesthesia may have negative outcomes, up to and  including death. If you test positive for cocaine, your surgery will be cancelled.  On the morning of surgery brush your teeth with toothpaste and water, you may rinse your mouth with mouthwash if you wish. Do not swallow any toothpaste or mouthwash.  Do not wear jewelry, make-up, hairpins, clips or nail polish.  Do not wear lotions, powders, or perfumes.   Do not shave body from the neck down 48 hours prior to surgery just in case you cut yourself which could leave a site for infection.  Also, freshly shaved skin may become irritated if using the CHG soap.  Contact lenses, hearing aids and dentures may not be worn into surgery.  Do not bring valuables to the hospital. [facility] is not responsible for any missing/lost belongings or valuables.   Use CHG Soap as directed on instruction sheet  Notify your doctor if there is any change in your medical condition (cold, fever, infection).  Wear comfortable clothing (specific to your surgery type) to the hospital.  After surgery, you can help prevent lung complications by doing breathing exercises.  Take deep breaths and cough every 1-2 hours. Your doctor may order a device called an Incentive Spirometer to help you take deep breaths. When coughing or sneezing, hold a pillow firmly against your incision with both hands. This is called "splinting." Doing this helps protect your incision. It also decreases belly discomfort.  If you are being admitted to the hospital overnight, leave your suitcase in the car. After surgery it may be brought to your room.  If you are being  discharged the day of surgery, you will not be allowed to drive home. You will need a responsible adult (18 years or older) to drive you home and stay with you that night.   If you are taking public transportation, you will need to have a responsible adult (18 years or older) with you. Please confirm with your physician that it is acceptable to use public transportation.    Please call the [facility] at [phone] if you have any questions about these instructions.  Surgery Visitation Policy:  Patients undergoing a surgery or procedure may have one family member or support person with them as long as that person is not COVID-19 positive or experiencing its symptoms.  That person may remain in the waiting area during the procedure.  Inpatient Visitation:    Visiting hours are 7 a.m. to 8 p.m. Inpatients will be allowed two visitors daily. The visitors may change each day during the patient's stay. No visitors under the age of 33. Any visitor under the age of 58 must be accompanied by an adult. The visitor must pass COVID-19 screenings, use hand sanitizer when entering and exiting the patient's room and wear a mask at all times, including in the patient's room. Patients must also wear a mask when staff or their visitor are in the room. Masking is required regardless of vaccination status.

## 2020-12-15 ENCOUNTER — Encounter
Admission: RE | Disposition: A | Discharge status: Home / Self Care | Payer: Self-pay | Source: Home / Self Care | Attending: Surgery

## 2020-12-15 ENCOUNTER — Ambulatory Visit
Admission: RE | Admit: 2020-12-15 | Discharge: 2020-12-15 | Disposition: A | Discharge status: Home / Self Care | Payer: Medicare Other | Attending: Surgery | Admitting: Surgery

## 2020-12-15 ENCOUNTER — Ambulatory Visit: Payer: Medicare Other | Admitting: Registered Nurse

## 2020-12-15 ENCOUNTER — Other Ambulatory Visit: Payer: Self-pay

## 2020-12-15 ENCOUNTER — Encounter: Payer: Self-pay | Admitting: Surgery

## 2020-12-15 DIAGNOSIS — Z87891 Personal history of nicotine dependence: Secondary | ICD-10-CM | POA: Diagnosis not present

## 2020-12-15 DIAGNOSIS — K4190 Unilateral femoral hernia, without obstruction or gangrene, not specified as recurrent: Secondary | ICD-10-CM | POA: Insufficient documentation

## 2020-12-15 DIAGNOSIS — Z888 Allergy status to other drugs, medicaments and biological substances status: Secondary | ICD-10-CM | POA: Insufficient documentation

## 2020-12-15 DIAGNOSIS — K4090 Unilateral inguinal hernia, without obstruction or gangrene, not specified as recurrent: Secondary | ICD-10-CM | POA: Insufficient documentation

## 2020-12-15 HISTORY — DX: Hyperlipidemia, unspecified: E78.5

## 2020-12-15 HISTORY — PX: XI ROBOTIC ASSISTED INGUINAL HERNIA REPAIR WITH MESH: SHX6706

## 2020-12-15 SURGERY — REPAIR, HERNIA, INGUINAL, ROBOT-ASSISTED, LAPAROSCOPIC, USING MESH
Anesthesia: General | Site: Groin | Laterality: Right

## 2020-12-15 MED ORDER — FENTANYL CITRATE (PF) 100 MCG/2ML IJ SOLN
INTRAMUSCULAR | Status: AC
  Filled 2020-12-15: qty 2

## 2020-12-15 MED ORDER — ACETAMINOPHEN 325 MG PO TABS: 650.0000 mg | ORAL_TABLET | Freq: Three times a day (TID) | ORAL | 0 refills | Status: AC | PRN

## 2020-12-15 MED ORDER — BUPIVACAINE-EPINEPHRINE 0.5% -1:200000 IJ SOLN
INTRAMUSCULAR | Status: DC | PRN
  Administered 2020-12-15: 30 mL

## 2020-12-15 MED ORDER — FENTANYL CITRATE (PF) 100 MCG/2ML IJ SOLN
INTRAMUSCULAR | Status: AC
  Administered 2020-12-15: 50 ug via INTRAVENOUS
  Filled 2020-12-15: qty 2

## 2020-12-15 MED ORDER — LACTATED RINGERS IV SOLN: INTRAVENOUS | Status: DC

## 2020-12-15 MED ORDER — MEPERIDINE HCL 25 MG/ML IJ SOLN: 6.2500 mg | INTRAMUSCULAR | Status: DC | PRN

## 2020-12-15 MED ORDER — FENTANYL CITRATE (PF) 100 MCG/2ML IJ SOLN: 25.0000 ug | INTRAMUSCULAR | Status: DC | PRN

## 2020-12-15 MED ORDER — BUPIVACAINE-EPINEPHRINE (PF) 0.5% -1:200000 IJ SOLN
INTRAMUSCULAR | Status: AC
  Filled 2020-12-15: qty 30

## 2020-12-15 MED ORDER — OXYCODONE HCL 5 MG PO TABS: 5.0000 mg | ORAL_TABLET | Freq: Once | ORAL | Status: DC | PRN

## 2020-12-15 MED ORDER — BUPIVACAINE LIPOSOME 1.3 % IJ SUSP
INTRAMUSCULAR | Status: DC | PRN
  Administered 2020-12-15: 20 mL

## 2020-12-15 MED ORDER — HYDROCODONE-ACETAMINOPHEN 5-325 MG PO TABS: 1.0000 | ORAL_TABLET | Freq: Four times a day (QID) | ORAL | 0 refills | Status: DC | PRN

## 2020-12-15 MED ORDER — DEXAMETHASONE SODIUM PHOSPHATE 10 MG/ML IJ SOLN
INTRAMUSCULAR | Status: DC | PRN
  Administered 2020-12-15: 5 mg via INTRAVENOUS

## 2020-12-15 MED ORDER — ACETAMINOPHEN 500 MG PO TABS: 1000.0000 mg | ORAL_TABLET | ORAL | Status: AC

## 2020-12-15 MED ORDER — FAMOTIDINE 20 MG PO TABS: 20.0000 mg | ORAL_TABLET | Freq: Once | ORAL | Status: AC

## 2020-12-15 MED ORDER — LIDOCAINE HCL (PF) 1 % IJ SOLN
INTRAMUSCULAR | Status: AC
  Filled 2020-12-15: qty 30

## 2020-12-15 MED ORDER — CELECOXIB 200 MG PO CAPS
ORAL_CAPSULE | ORAL | Status: AC
  Administered 2020-12-15: 200 mg via ORAL
  Filled 2020-12-15: qty 1

## 2020-12-15 MED ORDER — FAMOTIDINE 20 MG PO TABS
ORAL_TABLET | ORAL | Status: AC
  Administered 2020-12-15: 20 mg via ORAL
  Filled 2020-12-15: qty 1

## 2020-12-15 MED ORDER — HYDROCODONE-ACETAMINOPHEN 5-325 MG PO TABS
1.0000 | ORAL_TABLET | Freq: Once | ORAL | Status: AC
  Administered 2020-12-15: 1 via ORAL

## 2020-12-15 MED ORDER — EPHEDRINE SULFATE 50 MG/ML IJ SOLN
INTRAMUSCULAR | Status: DC | PRN
  Administered 2020-12-15 (×3): 5 mg via INTRAVENOUS

## 2020-12-15 MED ORDER — HYDROCODONE-ACETAMINOPHEN 5-325 MG PO TABS
ORAL_TABLET | ORAL | Status: AC
  Filled 2020-12-15: qty 1

## 2020-12-15 MED ORDER — BUPIVACAINE LIPOSOME 1.3 % IJ SUSP
INTRAMUSCULAR | Status: AC
  Filled 2020-12-15: qty 20

## 2020-12-15 MED ORDER — CEFAZOLIN SODIUM-DEXTROSE 2-4 GM/100ML-% IV SOLN
INTRAVENOUS | Status: AC
  Filled 2020-12-15: qty 100

## 2020-12-15 MED ORDER — LIDOCAINE HCL (CARDIAC) PF 100 MG/5ML IV SOSY
PREFILLED_SYRINGE | INTRAVENOUS | Status: DC | PRN
  Administered 2020-12-15: 100 mg via INTRAVENOUS

## 2020-12-15 MED ORDER — CHLORHEXIDINE GLUCONATE CLOTH 2 % EX PADS
6.0000 | MEDICATED_PAD | Freq: Once | CUTANEOUS | Status: AC
  Administered 2020-12-15: 6 via TOPICAL

## 2020-12-15 MED ORDER — ONDANSETRON HCL 4 MG/2ML IJ SOLN: 4.0000 mg | Freq: Once | INTRAMUSCULAR | Status: DC | PRN

## 2020-12-15 MED ORDER — FENTANYL CITRATE (PF) 100 MCG/2ML IJ SOLN
INTRAMUSCULAR | Status: DC | PRN
  Administered 2020-12-15: 50 ug via INTRAVENOUS
  Administered 2020-12-15: 25 ug via INTRAVENOUS

## 2020-12-15 MED ORDER — PHENYLEPHRINE HCL (PRESSORS) 10 MG/ML IV SOLN
INTRAVENOUS | Status: DC | PRN
  Administered 2020-12-15: 200 ug via INTRAVENOUS
  Administered 2020-12-15: 100 ug via INTRAVENOUS
  Administered 2020-12-15: 200 ug via INTRAVENOUS
  Administered 2020-12-15 (×2): 100 ug via INTRAVENOUS

## 2020-12-15 MED ORDER — ORAL CARE MOUTH RINSE: 15.0000 mL | Freq: Once | OROMUCOSAL | Status: AC

## 2020-12-15 MED ORDER — IBUPROFEN 800 MG PO TABS: 800.0000 mg | ORAL_TABLET | Freq: Three times a day (TID) | ORAL | 0 refills | Status: AC | PRN

## 2020-12-15 MED ORDER — DOCUSATE SODIUM 100 MG PO CAPS: 100.0000 mg | ORAL_CAPSULE | Freq: Two times a day (BID) | ORAL | 0 refills | Status: AC | PRN

## 2020-12-15 MED ORDER — 0.9 % SODIUM CHLORIDE (POUR BTL) OPTIME
TOPICAL | Status: DC | PRN
  Administered 2020-12-15: 500 mL

## 2020-12-15 MED ORDER — PROPOFOL 10 MG/ML IV BOLUS
INTRAVENOUS | Status: DC | PRN
  Administered 2020-12-15: 200 mg via INTRAVENOUS

## 2020-12-15 MED ORDER — GABAPENTIN 300 MG PO CAPS
300.0000 mg | ORAL_CAPSULE | ORAL | Status: AC
Start: 2020-12-15 — End: 2020-12-15

## 2020-12-15 MED ORDER — SUGAMMADEX SODIUM 500 MG/5ML IV SOLN
INTRAVENOUS | Status: AC
  Filled 2020-12-15: qty 5

## 2020-12-15 MED ORDER — ROCURONIUM BROMIDE 100 MG/10ML IV SOLN
INTRAVENOUS | Status: DC | PRN
Start: 2020-12-15 — End: 2020-12-15
  Administered 2020-12-15: 50 mg via INTRAVENOUS
  Administered 2020-12-15: 30 mg via INTRAVENOUS

## 2020-12-15 MED ORDER — CEFAZOLIN SODIUM-DEXTROSE 2-4 GM/100ML-% IV SOLN
2.0000 g | INTRAVENOUS | Status: AC
  Administered 2020-12-15: 2 g via INTRAVENOUS

## 2020-12-15 MED ORDER — ROCURONIUM BROMIDE 10 MG/ML (PF) SYRINGE
PREFILLED_SYRINGE | INTRAVENOUS | Status: AC
  Filled 2020-12-15: qty 10

## 2020-12-15 MED ORDER — GABAPENTIN 300 MG PO CAPS
ORAL_CAPSULE | ORAL | Status: AC
  Administered 2020-12-15: 300 mg via ORAL
  Filled 2020-12-15: qty 1

## 2020-12-15 MED ORDER — CHLORHEXIDINE GLUCONATE 0.12 % MT SOLN
OROMUCOSAL | Status: AC
  Administered 2020-12-15: 15 mL via OROMUCOSAL
  Filled 2020-12-15: qty 15

## 2020-12-15 MED ORDER — ONDANSETRON HCL 4 MG/2ML IJ SOLN
INTRAMUSCULAR | Status: DC | PRN
  Administered 2020-12-15: 4 mg via INTRAVENOUS

## 2020-12-15 MED ORDER — ACETAMINOPHEN 500 MG PO TABS
ORAL_TABLET | ORAL | Status: AC
  Administered 2020-12-15: 1000 mg via ORAL
  Filled 2020-12-15: qty 2

## 2020-12-15 MED ORDER — CELECOXIB 200 MG PO CAPS: 200.0000 mg | ORAL_CAPSULE | ORAL | Status: AC

## 2020-12-15 MED ORDER — CHLORHEXIDINE GLUCONATE 0.12 % MT SOLN: 15.0000 mL | Freq: Once | OROMUCOSAL | Status: AC

## 2020-12-15 MED ORDER — OXYCODONE HCL 5 MG/5ML PO SOLN: 5.0000 mg | Freq: Once | ORAL | Status: DC | PRN

## 2020-12-15 MED ORDER — SUGAMMADEX SODIUM 200 MG/2ML IV SOLN
INTRAVENOUS | Status: DC | PRN
  Administered 2020-12-15: 400 mg via INTRAVENOUS

## 2020-12-15 SURGICAL SUPPLY — 55 items
ADH SKN CLS APL DERMABOND .7 (GAUZE/BANDAGES/DRESSINGS) ×1
APL PRP STRL LF DISP 70% ISPRP (MISCELLANEOUS) ×2
BAG INFUSER PRESSURE 100CC (MISCELLANEOUS) IMPLANT
BLADE CLIPPER SURG (BLADE) ×2 IMPLANT
BLADE SURG SZ11 CARB STEEL (BLADE) ×2 IMPLANT
BNDG GAUZE ELAST 4 BULKY (GAUZE/BANDAGES/DRESSINGS) IMPLANT
CHLORAPREP W/TINT 26 (MISCELLANEOUS) ×4 IMPLANT
CLIPPER SURGICAL HAIR RECHARGE (MISCELLANEOUS)
COVER TIP SHEARS 8 DVNC (MISCELLANEOUS) ×1 IMPLANT
COVER TIP SHEARS 8MM DA VINCI (MISCELLANEOUS) ×1
COVER WAND RF STERILE (DRAPES) ×2 IMPLANT
DEFOGGER SCOPE WARMER CLEARIFY (MISCELLANEOUS) ×2 IMPLANT
DERMABOND ADVANCED (GAUZE/BANDAGES/DRESSINGS) ×1
DERMABOND ADVANCED .7 DNX12 (GAUZE/BANDAGES/DRESSINGS) ×1 IMPLANT
DRAPE ARM DVNC X/XI (DISPOSABLE) ×3 IMPLANT
DRAPE COLUMN DVNC XI (DISPOSABLE) ×1 IMPLANT
DRAPE DA VINCI XI ARM (DISPOSABLE) ×3
DRAPE DA VINCI XI COLUMN (DISPOSABLE) ×1
ELECT CAUTERY BLADE 6.4 (BLADE) IMPLANT
ELECT REM PT RETURN 9FT ADLT (ELECTROSURGICAL) ×2
ELECTRODE REM PT RTRN 9FT ADLT (ELECTROSURGICAL) ×1 IMPLANT
GAUZE 4X4 16PLY ~~LOC~~+RFID DBL (SPONGE) ×2 IMPLANT
GLOVE SURG SYN 6.5 ES PF (GLOVE) ×4 IMPLANT
GLOVE SURG UNDER POLY LF SZ7 (GLOVE) ×4 IMPLANT
GOWN STRL REUS W/ TWL LRG LVL3 (GOWN DISPOSABLE) ×3 IMPLANT
GOWN STRL REUS W/TWL LRG LVL3 (GOWN DISPOSABLE) ×6
IRRIGATOR SUCT 8 DISP DVNC XI (IRRIGATION / IRRIGATOR) IMPLANT
IRRIGATOR SUCTION 8MM XI DISP (IRRIGATION / IRRIGATOR)
IV NS 1000ML (IV SOLUTION)
IV NS 1000ML BAXH (IV SOLUTION) IMPLANT
LABEL OR SOLS (LABEL) ×2 IMPLANT
MANIFOLD NEPTUNE II (INSTRUMENTS) ×2 IMPLANT
MESH 3DMAX 4X6 RT LRG (Mesh General) ×1 IMPLANT
MESH 3DMAX MID 4X6 RT LRG (Mesh General) ×1 IMPLANT
NEEDLE HYPO 22GX1.5 SAFETY (NEEDLE) ×2 IMPLANT
NEEDLE INSUFFLATION 14GA 120MM (NEEDLE) ×2 IMPLANT
OBTURATOR OPTICAL STANDARD 8MM (TROCAR) ×1
OBTURATOR OPTICAL STND 8 DVNC (TROCAR) ×1
OBTURATOR OPTICALSTD 8 DVNC (TROCAR) ×1 IMPLANT
PACK LAP CHOLECYSTECTOMY (MISCELLANEOUS) ×2 IMPLANT
PENCIL ELECTRO HAND CTR (MISCELLANEOUS) ×2 IMPLANT
SEAL CANN UNIV 5-8 DVNC XI (MISCELLANEOUS) ×3 IMPLANT
SEAL XI 5MM-8MM UNIVERSAL (MISCELLANEOUS) ×3
SET TUBE SMOKE EVAC HIGH FLOW (TUBING) ×2 IMPLANT
SOLUTION ELECTROLUBE (MISCELLANEOUS) ×2 IMPLANT
SURGICAL HAIR CLIPPER RECHARGE (MISCELLANEOUS) IMPLANT
SUT MNCRL 4-0 (SUTURE) ×2
SUT MNCRL 4-0 27XMFL (SUTURE) ×1
SUT VIC AB 2-0 SH 27 (SUTURE) ×2
SUT VIC AB 2-0 SH 27XBRD (SUTURE) ×1 IMPLANT
SUT VLOC 90 6 CV-15 VIOLET (SUTURE) ×4 IMPLANT
SUTURE MNCRL 4-0 27XMF (SUTURE) ×1 IMPLANT
SYR 30ML LL (SYRINGE) ×2 IMPLANT
TAPE TRANSPORE STRL 2 31045 (GAUZE/BANDAGES/DRESSINGS) IMPLANT
WATER STERILE IRR 500ML POUR (IV SOLUTION) IMPLANT

## 2020-12-15 NOTE — Anesthesia Preprocedure Evaluation (Addendum)
Anesthesia Evaluation  Patient identified by MRN, date of birth, ID band Patient awake    Reviewed: Allergy & Precautions, NPO status , Patient's Chart, lab work & pertinent test results, reviewed documented beta blocker date and time   History of Anesthesia Complications Negative for: history of anesthetic complications  Airway Mallampati: I  TM Distance: >3 FB     Dental no notable dental hx.    Pulmonary former smoker,    Pulmonary exam normal        Cardiovascular Exercise Tolerance: Good Normal cardiovascular examI     Neuro/Psych    GI/Hepatic   Endo/Other    Renal/GU      Musculoskeletal   Abdominal Normal abdominal exam  (+)   Peds  Hematology   Anesthesia Other Findings   Reproductive/Obstetrics                             Anesthesia Physical Anesthesia Plan  ASA: 2  Anesthesia Plan: General   Post-op Pain Management:    Induction: Intravenous  PONV Risk Score and Plan:   Airway Management Planned: Oral ETT  Additional Equipment: None  Intra-op Plan:   Post-operative Plan: Extubation in OR  Informed Consent: I have reviewed the patients History and Physical, chart, labs and discussed the procedure including the risks, benefits and alternatives for the proposed anesthesia with the patient or authorized representative who has indicated his/her understanding and acceptance.       Plan Discussed with: CRNA  Anesthesia Plan Comments:         Anesthesia Quick Evaluation

## 2020-12-15 NOTE — Anesthesia Postprocedure Evaluation (Signed)
Anesthesia Post Note  Patient: Stuart Wise.  Procedure(s) Performed: XI ROBOTIC ASSISTED INGUINAL HERNIA REPAIR WITH MESH (Right: Groin)  Patient location during evaluation: PACU Anesthesia Type: General Level of consciousness: awake and alert Pain management: pain level controlled Vital Signs Assessment: post-procedure vital signs reviewed and stable Respiratory status: spontaneous breathing, nonlabored ventilation, respiratory function stable and patient connected to nasal cannula oxygen Cardiovascular status: blood pressure returned to baseline and stable Postop Assessment: no apparent nausea or vomiting Anesthetic complications: no   No notable events documented.   Last Vitals:  Vitals:   12/15/20 1200 12/15/20 1217  BP: 112/69 130/67  Pulse: 79 76  Resp: 14 16  Temp: (!) 36.3 C 36.5 C  SpO2: 95% 100%    Last Pain:  Vitals:   12/15/20 1217  TempSrc: Temporal  PainSc:                  Lennox Pippins

## 2020-12-15 NOTE — Interval H&P Note (Signed)
History and Physical Interval Note:  12/15/2020 9:08 AM  Stuart Wise.  has presented today for surgery, with the diagnosis of Non-recurrent unilateral inguinal hernia without obstruction or gangrene K40.90.  The various methods of treatment have been discussed with the patient and family. After consideration of risks, benefits and other options for treatment, the patient has consented to  Procedure(s): XI ROBOTIC Percival (Right) as a surgical intervention.  The patient's history has been reviewed, patient examined, no change in status, stable for surgery.  I have reviewed the patient's chart and labs.  Questions were answered to the patient's satisfaction.     [name]

## 2020-12-15 NOTE — Transfer of Care (Signed)
Immediate Anesthesia Transfer of Care Note  Patient: Stuart Wise.  Procedure(s) Performed: XI ROBOTIC ASSISTED INGUINAL HERNIA REPAIR WITH MESH (Right: Groin)  Patient Location: PACU  Anesthesia Type:General  Level of Consciousness: drowsy  Airway & Oxygen Therapy: Patient Spontanous Breathing and Patient connected to face mask oxygen  Post-op Assessment: Report given to RN and Post -op Vital signs reviewed and stable  Post vital signs: Reviewed and stable  Last Vitals:  Vitals Value Taken Time  BP 141/85 12/15/20 1111  Temp    Pulse 83 12/15/20 1114  Resp 17 12/15/20 1114  SpO2 99 % 12/15/20 1114  Vitals shown include unvalidated device data.  Last Pain:  Vitals:   12/15/20 0819  TempSrc: Oral  PainSc: 0-No pain         Complications: No notable events documented.

## 2020-12-15 NOTE — Op Note (Signed)
Preoperative diagnosis: right inguinal Hernia.  Postoperative diagnosis: right indirect inguinal and femoral Hernia, incarcerated  Procedure: Robotic assisted laparoscopic right indirect inguinal and femoral hernia repair with mesh  Anesthesia: General  Surgeon: Dr. Lysle Pearl  Wound Classification: Clean  Specimen: none  Complications: None  Estimated Blood Loss: 39m   Indications:  Repair was indicated to avoid complications of incarceration, obstruction and pain, and a prosthetic mesh repair was elected.  See H&P for further details.  Findings: 1. Vas Deferens and cord structures identified and preserved 2. Bard 3D max medium weight mesh used for repair 3. Adequate hemostasis achieved  Description of procedure: The patient was taken to the operating room. A time-out was completed verifying correct patient, procedure, site, positioning, and implant(s) and/or special equipment prior to beginning this procedure.  Area was prepped and draped in the usual sterile fashion. An incision was marked 20 cm above the pubic tubercle, slightly above the umbilicus  Scrotum wrapped in Kerlix roll.  Veress needle inserted at palmer's point.  Saline drop test noted to be positive with gradual increase in pressure after initiation of gas insufflation.  15 mm of pressure was achieved prior to removing the Veress needle and then placing a 8 mm port via the Optiview technique through the supraumbilical site.  Inspection of the area afterwards noted no injury to the surrounding organs during insertion of the needle and the port.  2 port sites were marked 8 cm to the lateral sides of the initial port, and a 8 mm robotic port was placed on the left side, another 8 mm robotic port on the right side under direct supervision.  Local anesthesia  infused to the preplanned incision sites prior to insertion of the port.  The dWilsonwas then brought into the operative field and docked to the  ports.  Examination of the abdominal cavity noted a right indirect inguinal hernia.  A peritoneal flap was created approximately 8cm cephalad to the defect by using scissors with electrocautery.  Dissection was carried down towards the pubic tubercle, developing the myopectineal orifice view.  Laterally the flap was carried towards the ASIS.  Small hernia sac was noted, which carefully dissected away from the adjacent tissues to be fully reduced out of hernia cavity.  Any bleeding was controlled with combination of electrocautery and manual pressure.  During dissection, additional lipomatous tissue above the bladder was noted to be stuck medial to the epigastric vessels towards the pubic arch.  Additional exam of the area noted a femoral hernia with incarcerated fat contents within it.  This was carefully dissected away and reducted, care to not injure the adjacent vessels.  After confirming adequate dissection and the peritoneal reflection completely down and away from the cord structures and hernia defects, a Large Bard 3DMax medium weight mesh was placed within the anterior abdominal wall, secured in place using 2-0 Vicryl on an SH needle immediately above the pubic tubercle. The reduced fat from femoral defect was pexied to the mesh to ensure it does not slide underneath it. After noting proper placement of the mesh with the peritoneal reflection deep to it, the previously created peritoneal flap was secured back up to the anterior abdominal wall using running 3-0 V-Lock.  Both needles were then removed out of the abdominal cavity, Xi platform undocked from the ports and removed off of operative field.  exparel infused as ilioinguinal block.  Abdomen then desufflated and ports removed. All the skin incisions were then closed with  a subcuticular stitch of Monocryl 4-0. Dermabond was applied. The testis was gently pulled down into its anatomic position in the scrotum.  The patient tolerated the procedure  well and was taken to the postanesthesia care unit in stable condition. Sponge and instrument count correct at end of procedure.

## 2020-12-15 NOTE — Anesthesia Procedure Notes (Signed)
Procedure Name: Intubation Date/Time: 12/15/2020 9:24 AM Performed by: Lia Foyer, CRNA Pre-anesthesia Checklist: Patient identified, Emergency Drugs available, Suction available and Patient being monitored Patient Re-evaluated:Patient Re-evaluated prior to induction Oxygen Delivery Method: Circle system utilized Preoxygenation: Pre-oxygenation with 100% oxygen Induction Type: IV induction Ventilation: Mask ventilation without difficulty Laryngoscope Size: McGraph, Mac and 4 Grade View: Grade I Tube type: Oral Tube size: 7.5 mm Number of attempts: 2 Airway Equipment and Method: Stylet and Oral airway Placement Confirmation: ETT inserted through vocal cords under direct vision, positive ETCO2 and breath sounds checked- equal and bilateral Secured at: 23 cm Tube secured with: Tape Dental Injury: Teeth and Oropharynx as per pre-operative assessment  Comments: Grade 4 view with DL per Doyne Keel. Switched to [employer]...Grade 1 view with Mcgrath.

## 2020-12-15 NOTE — Discharge Instructions (Addendum)
Hernia repair, Care After This sheet gives you information about how to care for yourself after your procedure. Your health care provider may also give you more specific instructions. If you have problems or questions, contact your health care provider. What can I expect after the procedure? After your procedure, it is common to have the following: Pain in your abdomen, especially in the incision areas. You will be given medicine to control the pain. Tiredness. This is a normal part of the recovery process. Your energy level will return to normal over the next several weeks. Changes in your bowel movements, such as constipation or needing to go more often. Talk with your health care provider about how to manage this. Follow these instructions at home: Medicines  tylenol and advil as needed for discomfort.  Please alternate between the two every four hours as needed for pain.    Use narcotics, if prescribed, only when tylenol and motrin is not enough to control pain.  325-650mg every 8hrs to max of 3000mg/24hrs (including the 325mg in every norco dose) for the tylenol.    Advil up to 800mg per dose every 8hrs as needed for pain.   PLEASE RECORD NUMBER OF PILLS TAKEN UNTIL NEXT FOLLOW UP APPT.  THIS WILL HELP DETERMINE HOW READY YOU ARE TO BE RELEASED FROM ANY ACTIVITY RESTRICTIONS Do not drive or use heavy machinery while taking prescription pain medicine. Do not drink alcohol while taking prescription pain medicine.  Incision care    Follow instructions from your health care provider about how to take care of your incision areas. Make sure you: Keep your incisions clean and dry. Wash your hands with soap and water before and after applying medicine to the areas, and before and after changing your bandage (dressing). If soap and water are not available, use hand sanitizer. Change your dressing as told by your health care provider. Leave stitches (sutures), skin glue, or adhesive strips in  place. These skin closures may need to stay in place for 2 weeks or longer. If adhesive strip edges start to loosen and curl up, you may trim the loose edges. Do not remove adhesive strips completely unless your health care provider tells you to do that. Do not wear tight clothing over the incisions. Tight clothing may rub and irritate the incision areas, which may cause the incisions to open. Do not take baths, swim, or use a hot tub until your health care provider approves. OK TO SHOWER IN 24HRS.   Check your incision area every day for signs of infection. Check for: More redness, swelling, or pain. More fluid or blood. Warmth. Pus or a bad smell. Activity Avoid lifting anything that is heavier than 10 lb (4.5 kg) for 2 weeks or until your health care provider says it is okay. No pushing/pulling greater than 30lbs You may resume normal activities as told by your health care provider. Ask your health care provider what activities are safe for you. Take rest breaks during the day as needed. Eating and drinking Follow instructions from your health care provider about what you can eat after surgery. To prevent or treat constipation while you are taking prescription pain medicine, your health care provider may recommend that you: Drink enough fluid to keep your urine clear or pale yellow. Take over-the-counter or prescription medicines. Eat foods that are high in fiber, such as fresh fruits and vegetables, whole grains, and beans. Limit foods that are high in fat and processed sugars, such as fried and   sweet foods. General instructions Ask your health care provider when you will need an appointment to get your sutures or staples removed. Keep all follow-up visits as told by your health care provider. This is important. Contact a health care provider if: You have more redness, swelling, or pain around your incisions. You have more fluid or blood coming from the incisions. Your incisions feel  warm to the touch. You have pus or a bad smell coming from your incisions or your dressing. You have a fever. You have an incision that breaks open (edges not staying together) after sutures or staples have been removed. Get help right away if: You develop a rash. You have chest pain or difficulty breathing. You have pain or swelling in your legs. You feel light-headed or you faint. Your abdomen swells (becomes distended). You have nausea or vomiting. You have blood in your stool (feces). This information is not intended to replace advice given to you by your health care provider. Make sure you discuss any questions you have with your health care provider. Document Released: 10/06/2004 Document Revised: 12/06/2017 Document Reviewed: 12/19/2015 Elsevier Interactive Patient Education  2019 [place]   The drugs that you were given will stay in your system until tomorrow so for the next 24 hours you should not:  Drive an automobile Make any legal decisions Drink any alcoholic beverage   You may resume regular meals tomorrow.  Today it is better to start with liquids and gradually work up to solid foods.  You may eat anything you prefer, but it is better to start with liquids, then soup and crackers, and gradually work up to solid foods.   Please notify your doctor immediately if you have any unusual bleeding, trouble breathing, redness and pain at the surgery site, drainage, fever, or pain not relieved by medication.    Your post-operative visit with Dr.                                       is: Date:                        Time:    Please call to schedule your post-operative visit.  Additional Instructions:

## 2020-12-16 ENCOUNTER — Encounter: Payer: Self-pay | Admitting: Surgery

## 2021-05-22 ENCOUNTER — Other Ambulatory Visit: Payer: Self-pay

## 2021-05-22 ENCOUNTER — Ambulatory Visit
Admission: EM | Admit: 2021-05-22 | Discharge: 2021-05-22 | Disposition: A | Discharge status: Home / Self Care | Payer: Medicare Other | Attending: Internal Medicine | Admitting: Internal Medicine

## 2021-05-22 DIAGNOSIS — J069 Acute upper respiratory infection, unspecified: Secondary | ICD-10-CM

## 2021-05-22 MED ORDER — FLUTICASONE PROPIONATE 50 MCG/ACT NA SUSP: 1.0000 | Freq: Every day | NASAL | 0 refills | Status: AC

## 2021-05-22 MED ORDER — BENZONATATE 200 MG PO CAPS: 200.0000 mg | ORAL_CAPSULE | Freq: Three times a day (TID) | ORAL | 0 refills | Status: DC | PRN

## 2021-05-22 NOTE — Discharge Instructions (Signed)
Increase oral fluid intake Take medications as prescribed If symptoms worsen please return to urgent care to be evaluated Continue humidifier use as well as vapor rub use. Return to urgent care if symptoms worsen.

## 2021-05-22 NOTE — ED Triage Notes (Signed)
Patient here with "Cough, Congestion" for about "2 wks now". Cough is persistent. No fever.

## 2021-05-23 NOTE — ED Provider Notes (Signed)
[facility]    CSN: [id number] Arrival date & time: 05/22/21  1310      History   Chief Complaint Chief Complaint  Patient presents with   Cough   Nasal Congestion    HPI Stuart Wise. is a 71 y.o. male is brought to urgent care with 2 week history of cough, sore throat and nasal congestion.  Patient's symptoms started a couple of weeks ago and has been persistent.  No fever or chills.  No shortness of breath or chest tightness.  Cough is disruptive to sleep pattern. No fever or chills, No generalized body aches. No chest pain or pressure.   HPI  Past Medical History:  Diagnosis Date   Anxiety    Arthritis    osteoarthritis   BPH (benign prostatic hyperplasia)    Colon polyps    Depression    Encounter for colonoscopy due to history of adenomatous colonic polyps    Erectile dysfunction    Hay fever    Hyperlipemia    Mass of pancreas     Patient Active Problem List   Diagnosis Date Noted   MDD (major depressive disorder), recurrent severe, without psychosis ([place])    Mass of pancreas 04/15/2014   Arthritis, degenerative 06/22/2013   Anxiety 12/06/2011   Benign fibroma of prostate 12/06/2011   Colon polyp 12/06/2011   ED (erectile dysfunction) of organic origin 12/06/2011   Hay fever 12/06/2011   Blood in the urine 12/06/2011   Major depressive episode 12/06/2011   Prostate lump 12/06/2011   Encounter for other specified special examinations 12/06/2011    Past Surgical History:  Procedure Laterality Date   COLONOSCOPY     COLONOSCOPY WITH PROPOFOL N/A 09/05/2016   Procedure: COLONOSCOPY WITH PROPOFOL;  Surgeon: Manya Silvas, MD;  Location: [facility] ENDOSCOPY;  Service: Endoscopy;  Laterality: N/A;   EYE SURGERY     HERNIA REPAIR     Inguinal left   XI ROBOTIC ASSISTED INGUINAL HERNIA REPAIR WITH MESH Right 12/15/2020   Procedure: XI ROBOTIC ASSISTED INGUINAL HERNIA REPAIR WITH MESH;  Surgeon: Benjamine Sprague, DO;  Location: [facility] ORS;   Service: General;  Laterality: Right;       Home Medications    Prior to Admission medications   Medication Sig Start Date End Date Taking? Authorizing Provider  atorvastatin (LIPITOR) 40 MG tablet Take 20 mg by mouth at bedtime. 11/03/20  Yes [provider]  benzonatate (TESSALON) 200 MG capsule Take 1 capsule (200 mg total) by mouth 3 (three) times daily as needed for cough. 05/22/21  Yes Jaleigha Deane, Myrene Galas, MD  fluticasone (FLONASE) 50 MCG/ACT nasal spray Place 1 spray into both nostrils daily. 05/22/21  Yes Maydell Knoebel, Myrene Galas, MD  tamsulosin (FLOMAX) 0.4 MG CAPS capsule Take 0.4 mg by mouth in the morning and at bedtime.   Yes [provider]  HYDROcodone-acetaminophen (NORCO) 5-325 MG tablet Take 1 tablet by mouth every 6 (six) hours as needed for up to 6 doses for moderate pain. 12/15/20   Lysle Pearl, Isami, DO  ibuprofen (ADVIL) 800 MG tablet Take 1 tablet (800 mg total) by mouth every 8 (eight) hours as needed for mild pain or moderate pain. 12/15/20   Lysle Pearl, Isami, DO  loratadine (CLARITIN) 10 MG tablet Take 10 mg by mouth every morning.    [provider]  Melatonin 10 MG TABS Take 10 mg by mouth at bedtime.    [provider]  Multiple Vitamin (MULTIVITAMIN WITH MINERALS) TABS  tablet Take 1 tablet by mouth daily.    [provider]  sildenafil (REVATIO) 20 MG tablet Take 20 mg by mouth daily as needed (ED).    [provider]    Family History Family History  Problem Relation Age of Onset   Depression Mother    Hypertension Mother    Hyperlipidemia Mother    Depression Father    Hypertension Father    Hyperlipidemia Father    Depression Sister     Social History Social History   Tobacco Use   Smoking status: Former    Packs/day: 1.00    Years: 30.00    Pack years: 30.00    Types: Cigarettes    Quit date: 2007    Years since quitting: 16.1   Smokeless tobacco: Never  Vaping Use   Vaping Use: Never used  Substance Use  Topics   Alcohol use: Yes    Comment: occ beer   Drug use: No     Allergies   Prednisone   Review of Systems Review of Systems  Constitutional: Negative.   HENT:  Positive for congestion. Negative for rhinorrhea.   Respiratory:  Positive for cough. Negative for shortness of breath and wheezing.   Gastrointestinal: Negative.   Musculoskeletal: Negative.     Physical Exam Triage Vital Signs ED Triage Vitals  Enc Vitals Group     BP 05/22/21 1330 (!) 123/111     Pulse Rate 05/22/21 1330 76     Resp 05/22/21 1330 20     Temp 05/22/21 1330 98.3 F (36.8 C)     Temp Source 05/22/21 1330 Oral     SpO2 05/22/21 1330 99 %     Weight 05/22/21 1332 215 lb (97.5 kg)     Height 05/22/21 1332 6' (1.829 m)     Head Circumference --      Peak Flow --      Pain Score 05/22/21 1330 0     Pain Loc --      Pain Edu? --      Excl. in [place]? --    No data found.  Updated Vital Signs BP (!) 123/111 (BP Location: Left Arm)    Pulse 76    Temp 98.3 F (36.8 C) (Oral)    Resp 20    Ht 6' (1.829 m)    Wt 97.5 kg    SpO2 99%    BMI 29.16 kg/m   Visual Acuity Right Eye Distance:   Left Eye Distance:   Bilateral Distance:    Right Eye Near:   Left Eye Near:    Bilateral Near:     Physical Exam Vitals and nursing note reviewed.  Constitutional:      General: He is not in acute distress.    Appearance: Normal appearance. He is not ill-appearing.  Eyes:     Extraocular Movements: Extraocular movements intact.     Pupils: Pupils are equal, round, and reactive to light.  Cardiovascular:     Rate and Rhythm: Normal rate and regular rhythm.     Pulses: Normal pulses.     Heart sounds: Normal heart sounds.  Pulmonary:     Effort: Pulmonary effort is normal.     Breath sounds: Normal breath sounds.  Abdominal:     General: Abdomen is flat. Bowel sounds are normal.  Neurological:     Mental Status: He is alert.     UC Treatments / Results  Labs (all labs ordered are listed,  but  only abnormal results are displayed) Labs Reviewed - No data to display  EKG   Radiology No results found.  Procedures Procedures (including critical care time)  Medications Ordered in UC Medications - No data to display  Initial Impression / Assessment and Plan / UC Course  I have reviewed the triage vital signs and the nursing notes.  Pertinent labs & imaging results that were available during my care of the patient were reviewed by me and considered in my medical decision making (see chart for details).     1.  Viral URI with cough Tessalon Perles as needed for cough Fluticasone nasal spray Humidifier use and vapor rub use to help with nasal congestion and cough. Return precautions given No indication for following COVID test  Final Clinical Impressions(s) / UC Diagnoses   Final diagnoses:  Viral URI with cough     Discharge Instructions      Increase oral fluid intake Take medications as prescribed If symptoms worsen please return to urgent care to be evaluated Continue humidifier use as well as vapor rub use. Return to urgent care if symptoms worsen.   ED Prescriptions     Medication Sig Dispense Auth. Provider   benzonatate (TESSALON) 200 MG capsule Take 1 capsule (200 mg total) by mouth 3 (three) times daily as needed for cough. 21 capsule Shyrl Obi, Myrene Galas, MD   fluticasone (FLONASE) 50 MCG/ACT nasal spray Place 1 spray into both nostrils daily. 16 g Samrat Hayward, Myrene Galas, MD      PDMP not reviewed this encounter.   Chase Picket, MD 05/23/21 [phone]

## 2021-05-31 DEATH — deceased

## 2021-08-09 ENCOUNTER — Ambulatory Visit
Admission: RE | Admit: 2021-08-09 | Discharge: 2021-08-09 | Disposition: A | Discharge status: Home / Self Care | Payer: Medicare Other | Source: Ambulatory Visit | Attending: Family Medicine | Admitting: Family Medicine

## 2021-08-09 ENCOUNTER — Telehealth: Payer: Self-pay | Admitting: Family Medicine

## 2021-08-09 ENCOUNTER — Ambulatory Visit
Admission: EM | Admit: 2021-08-09 | Discharge: 2021-08-09 | Disposition: A | Discharge status: Home / Self Care | Payer: Medicare Other | Attending: Family Medicine | Admitting: Family Medicine

## 2021-08-09 DIAGNOSIS — M79604 Pain in right leg: Secondary | ICD-10-CM | POA: Insufficient documentation

## 2021-08-09 DIAGNOSIS — M7989 Other specified soft tissue disorders: Secondary | ICD-10-CM

## 2021-08-09 NOTE — Discharge Instructions (Addendum)
We have ordered an ultrasound of the veins of your right leg, to make sure there is not a clot there ?

## 2021-08-09 NOTE — Telephone Encounter (Signed)
Received notification by telephone that pt's venous doppler was negative for DVT. Spoke with pt/wife by phone, and verified identity and gave results. They will get in with his PCP in the next couple of weeks. ?

## 2021-08-09 NOTE — ED Provider Notes (Signed)
?[facility] ? ? ? ?CSN: [id number] ?Arrival date & time: 08/09/21  1259 ? ? ?  ? ?History   ?Chief Complaint ?Chief Complaint  ?Patient presents with  ? Leg Swelling  ? ? ?HPI ?Stuart Wise. is a 71 y.o. male.  ? ?HPI ?Here for swelling and some very mild discomfort in his right lower leg that he noted when coming in from working in the yard today.  No insect bite or trauma. ? ?No fever no chest pain.  No dyspnea. ? ?Past Medical History:  ?Diagnosis Date  ? Anxiety   ? Arthritis   ? osteoarthritis  ? BPH (benign prostatic hyperplasia)   ? Colon polyps   ? Depression   ? Encounter for colonoscopy due to history of adenomatous colonic polyps   ? Erectile dysfunction   ? Hay fever   ? Hyperlipemia   ? Mass of pancreas   ? ? ?Patient Active Problem List  ? Diagnosis Date Noted  ? Hx of adenomatous colonic polyps 06/22/2016  ? Constipation 06/22/2016  ? MDD (major depressive disorder), recurrent severe, without psychosis ([place])   ? Mass of pancreas 04/15/2014  ? Osteoarthritis 06/22/2013  ? Anxiety 12/06/2011  ? BPH (benign prostatic hyperplasia) 12/06/2011  ? Colon polyp 12/06/2011  ? ED (erectile dysfunction) 12/06/2011  ? Hay fever 12/06/2011  ? Hematuria 12/06/2011  ? Major depressive disorder with single episode, in remission ([place]) 12/06/2011  ? Prostate nodule 12/06/2011  ? Tests ordered 12/06/2011  ? ? ?Past Surgical History:  ?Procedure Laterality Date  ? COLONOSCOPY    ? COLONOSCOPY WITH PROPOFOL N/A 09/05/2016  ? Procedure: COLONOSCOPY WITH PROPOFOL;  Surgeon: Manya Silvas, MD;  Location: [facility] ENDOSCOPY;  Service: Endoscopy;  Laterality: N/A;  ? EYE SURGERY    ? HERNIA REPAIR    ? Inguinal left  ? XI ROBOTIC ASSISTED INGUINAL HERNIA REPAIR WITH MESH Right 12/15/2020  ? Procedure: XI ROBOTIC ASSISTED INGUINAL HERNIA REPAIR WITH MESH;  Surgeon: Benjamine Sprague, DO;  Location: [facility] ORS;  Service: General;  Laterality: Right;  ? ? ? ? ? ?Home Medications   ? ?Prior to Admission medications    ?Medication Sig Start Date End Date Taking? Authorizing Provider  ?atorvastatin (LIPITOR) 40 MG tablet Take 1 tablet by mouth daily. 03/07/20  Yes [provider]  ?fluticasone (FLONASE) 50 MCG/ACT nasal spray Place 1 spray into both nostrils daily. 05/22/21  Yes Lamptey, Myrene Galas, MD  ?loratadine (CLARITIN) 10 MG tablet Take 10 mg by mouth every morning.   Yes [provider]  ?Multiple Vitamin (MULTIVITAMIN WITH MINERALS) TABS tablet Take 1 tablet by mouth daily.   Yes [provider]  ?prednisoLONE acetate (PRED FORTE) 1 % ophthalmic suspension  12/12/20  Yes [provider]  ?tamsulosin (FLOMAX) 0.4 MG CAPS capsule Take 0.4 mg by mouth in the morning and at bedtime.   Yes [provider]  ?ibuprofen (ADVIL) 800 MG tablet Take 1 tablet (800 mg total) by mouth every 8 (eight) hours as needed for mild pain or moderate pain. 12/15/20   Benjamine Sprague, DO  ?Melatonin 10 MG TABS Take 10 mg by mouth at bedtime.    [provider]  ?sildenafil (REVATIO) 20 MG tablet Take 20 mg by mouth daily as needed (ED).    [provider]  ? ? ?Family History ?Family History  ?Problem Relation Age of Onset  ? Depression Mother   ? Hypertension Mother   ? Hyperlipidemia Mother   ?  Depression Father   ? Hypertension Father   ? Hyperlipidemia Father   ? Depression Sister   ? ? ?Social History ?Social History  ? ?Tobacco Use  ? Smoking status: Former  ?  Packs/day: 1.00  ?  Years: 30.00  ?  Pack years: 30.00  ?  Types: Cigarettes  ?  Quit date: 2007  ?  Years since quitting: 16.3  ? Smokeless tobacco: Never  ?Vaping Use  ? Vaping Use: Never used  ?Substance Use Topics  ? Alcohol use: Yes  ?  Comment: occ beer  ? Drug use: No  ? ? ? ?Allergies   ?Prednisone ? ? ?Review of Systems ?Review of Systems ? ? ?Physical Exam ?Triage Vital Signs ?ED Triage Vitals  ?Enc Vitals Group  ?   BP 08/09/21 1340 131/73  ?   Pulse Rate 08/09/21 1340 78  ?   Resp 08/09/21 1340 18  ?   Temp 08/09/21  1340 97.8 ?F (36.6 ?C)  ?   Temp Source 08/09/21 1340 Oral  ?   SpO2 08/09/21 1340 98 %  ?   Weight 08/09/21 1337 218 lb (98.9 kg)  ?   Height 08/09/21 1337 6' (1.829 m)  ?   Head Circumference --   ?   Peak Flow --   ?   Pain Score 08/09/21 1334 4  ?   Pain Loc --   ?   Pain Edu? --   ?   Excl. in [place]? --   ? ?No data found. ? ?Updated Vital Signs ?BP 131/73 (BP Location: Left Arm)   Pulse 78   Temp 97.8 ?F (36.6 ?C) (Oral)   Resp 18   Ht 6' (1.829 m)   Wt 98.9 kg   SpO2 98%   BMI 29.57 kg/m?  ? ?Visual Acuity ?Right Eye Distance:   ?Left Eye Distance:   ?Bilateral Distance:   ? ?Right Eye Near:   ?Left Eye Near:    ?Bilateral Near:    ? ?Physical Exam ?Vitals reviewed.  ?Constitutional:   ?   General: He is not in acute distress. ?   Appearance: He is not toxic-appearing.  ?HENT:  ?   Mouth/Throat:  ?   Mouth: Mucous membranes are moist.  ?Eyes:  ?   Extraocular Movements: Extraocular movements intact.  ?   Pupils: Pupils are equal, round, and reactive to light.  ?Cardiovascular:  ?   Rate and Rhythm: Normal rate and regular rhythm.  ?   Heart sounds: No murmur heard. ?Pulmonary:  ?   Effort: Pulmonary effort is normal.  ?   Breath sounds: Normal breath sounds.  ?Musculoskeletal:  ?   Right lower leg: Edema (very mild edema. Mild tenderness of the calf. Neg homans) present.  ?   Left lower leg: No edema.  ?Skin: ?   Coloration: Skin is not jaundiced or pale.  ?Neurological:  ?   Mental Status: He is alert and oriented to person, place, and time.  ?Psychiatric:     ?   Behavior: Behavior normal.  ? ? ? ?UC Treatments / Results  ?Labs ?(all labs ordered are listed, but only abnormal results are displayed) ?Labs Reviewed - No data to display ? ?EKG ? ? ?Radiology ?No results found. ? ?Procedures ?Procedures (including critical care time) ? ?Medications Ordered in UC ?Medications - No data to display ? ?Initial Impression / Assessment and Plan / UC Course  ?I have reviewed the triage vital signs and the nursing  notes. ? ?Pertinent labs & imaging results that were available during my care of the patient were reviewed by me and considered in my medical decision making (see chart for details). ? ?  ?We will do a venous doppler to ensure no DVT. Scheduled at [facility] for today. ?Final Clinical Impressions(s) / UC Diagnoses  ? ?Final diagnoses:  ?Right leg swelling  ? ? ? ?Discharge Instructions   ? ?  ?We have ordered an ultrasound of the veins of your right leg, to make sure there is not a clot there ? ? ? ?ED Prescriptions   ?None ?  ? ?PDMP not reviewed this encounter. ?  ?Barrett Henle, MD ?08/09/21 1500 ? ?

## 2021-08-09 NOTE — ED Triage Notes (Signed)
Patient is here for "Right leg swelling, some pain, doesn't seem to be foot at all". No bite/sting/injury. First noticed "this morning".   ?

## 2021-09-07 IMAGING — CT CT CHEST LUNG CANCER SCREENING LOW DOSE W/O CM
2 of 5 series · 15 of 40 positions shown, 18 images · non-contrast
Comparison: None.

CLINICAL DATA: The 68-year-old male with 30 pack-year history of
smoking. Lung cancer screening.

EXAM:
CT CHEST WITHOUT CONTRAST LOW-DOSE FOR LUNG CANCER SCREENING
TECHNIQUE: Multidetector CT imaging of the chest was performed following the
standard protocol without IV contrast.

[Series 3: lung 1.00 · axial · 0.70mm/px · z∈[-1312,-958]mm · 12 of 390 slices shown, 15 images]
[im 18/390  mediastinal]
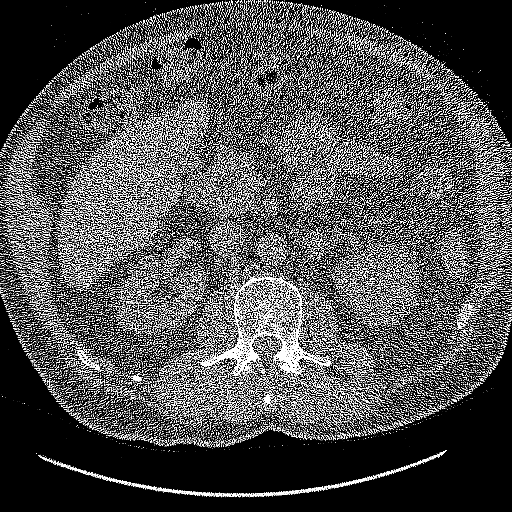
[im 18/390  lung]
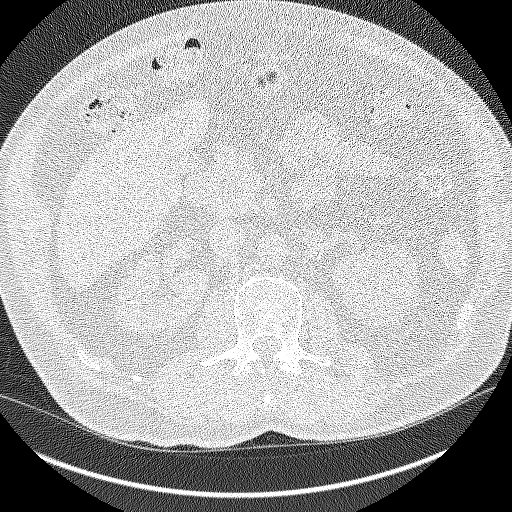
[im 54/390  lung]
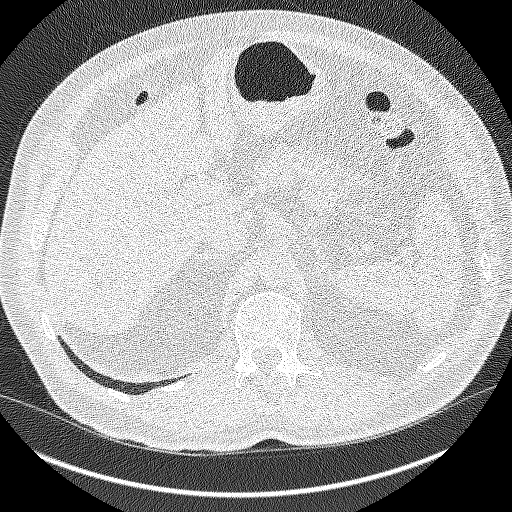
[im 89/390  lung]
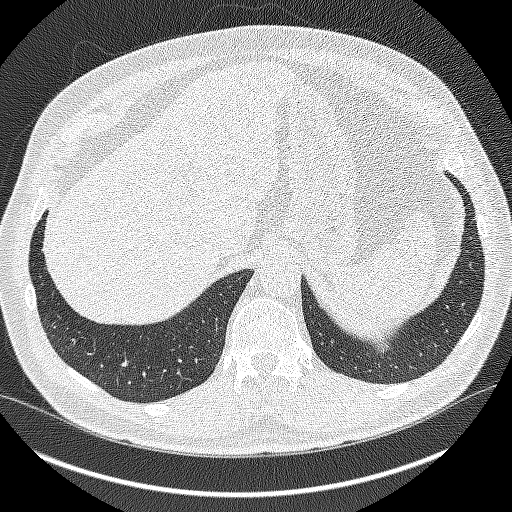
[im 124/390  lung]
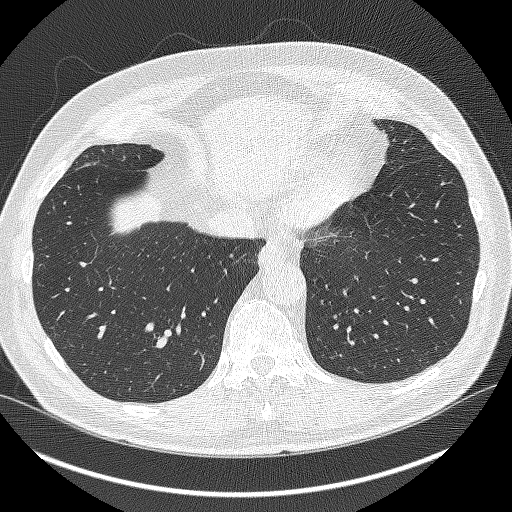
[im 142/390  mediastinal]
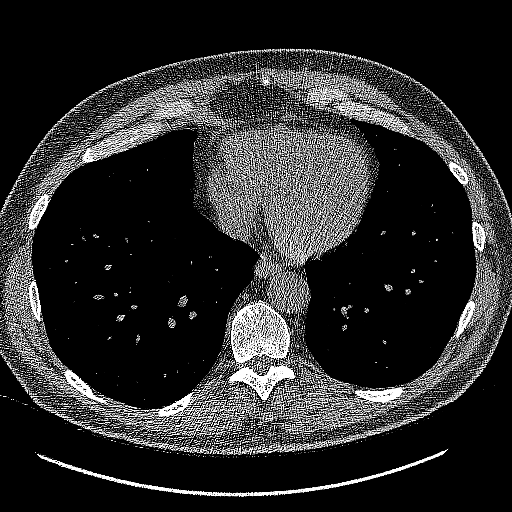
[im 142/390  lung]
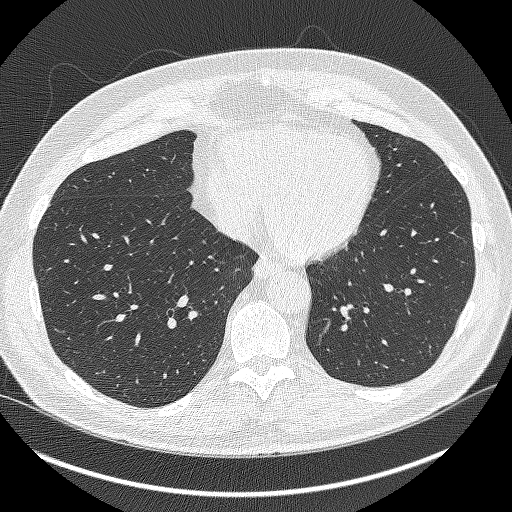
[im 177/390  lung]
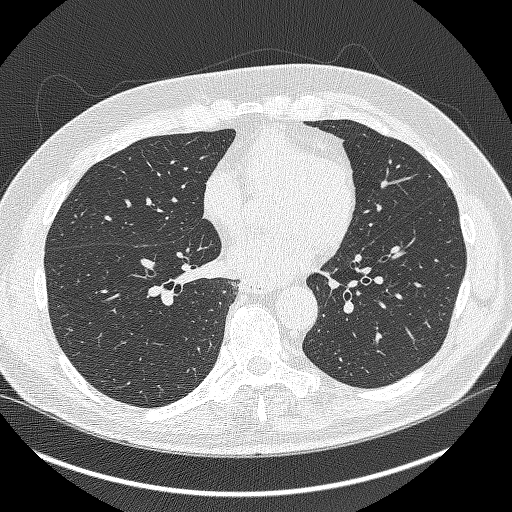
[im 213/390  lung]
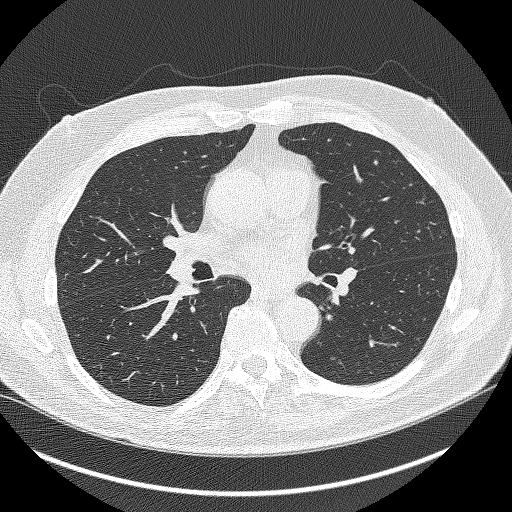
[im 248/390  lung]
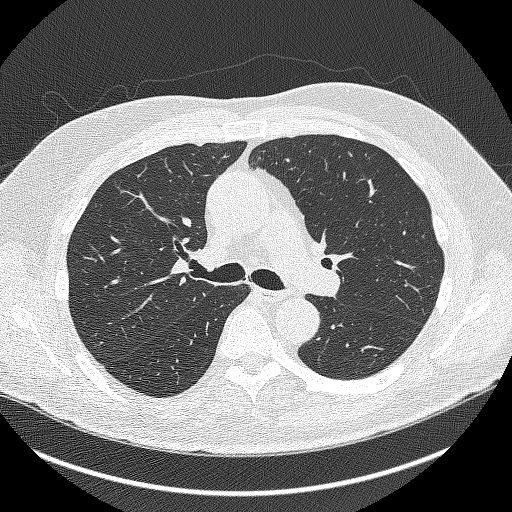
[im 266/390  mediastinal]
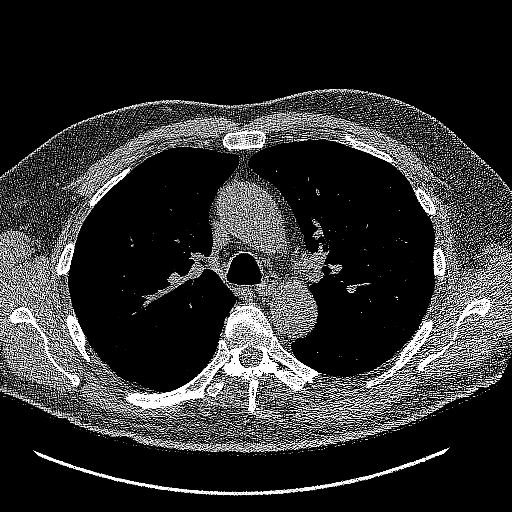
[im 266/390  lung]
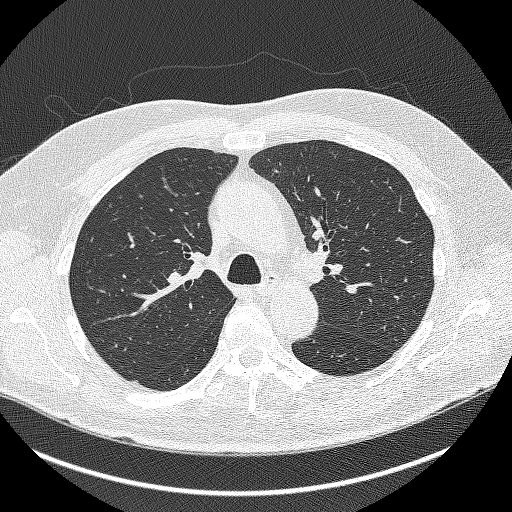
[im 301/390  lung]
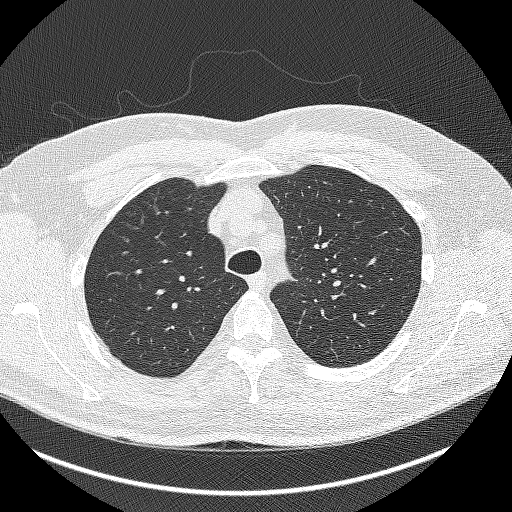
[im 336/390  lung]
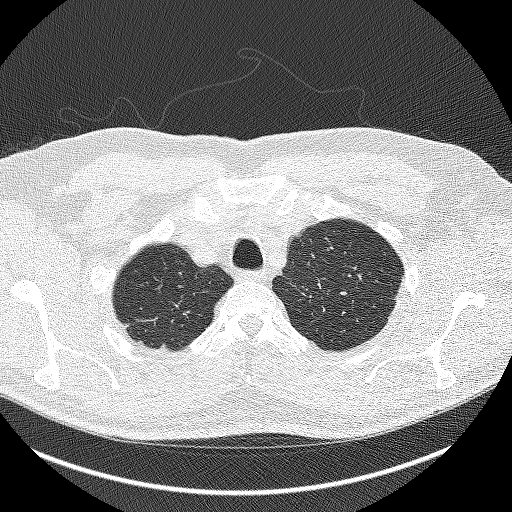
[im 372/390  lung]
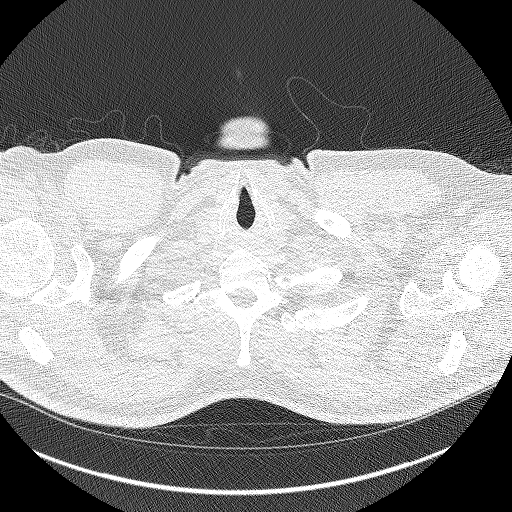

[Series 4: coronals lung 1.00 cor · coronal · 0.70mm/px · 3 of 302 slices shown]
[im 61/302  lung]
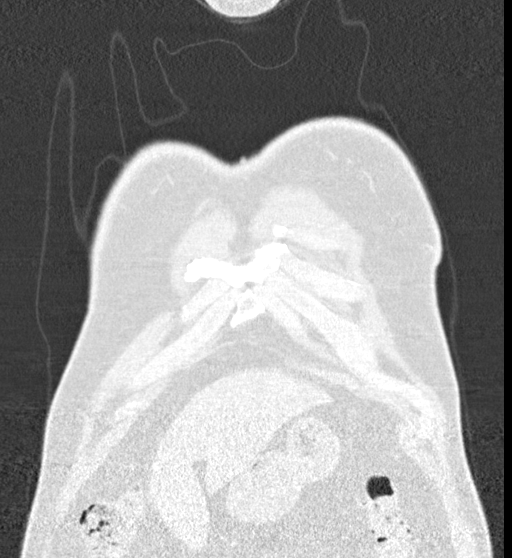
[im 121/302  lung]
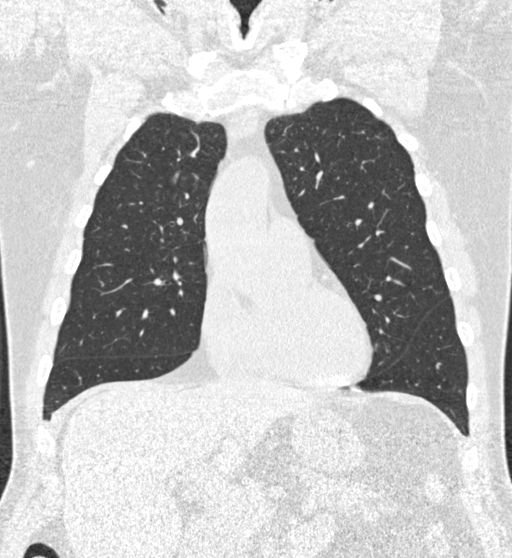
[im 181/302  lung]
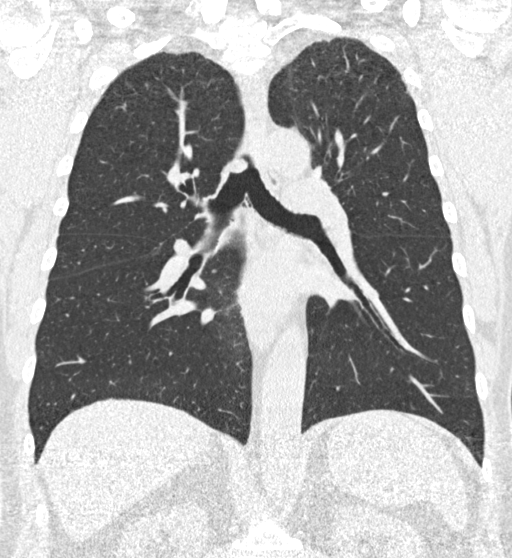

[15 of 40 positions shown; findings below may reference images not displayed]

FINDINGS: Cardiovascular: The heart size is normal. No substantial pericardial
effusion. Coronary artery calcification is evident. Atherosclerotic
calcification is noted in the wall of the thoracic aorta. Ascending
thoracic aorta measures 4.4 cm diameter.

Mediastinum/Nodes: No mediastinal lymphadenopathy. No evidence for
gross hilar lymphadenopathy although assessment is limited by the
lack of intravenous contrast on today's study. The esophagus has
normal imaging features. There is no axillary lymphadenopathy.

Lungs/Pleura: Mild biapical pleuroparenchymal scarring evident.
Centrilobular emphsyema noted. Several scattered tiny bilateral
pulmonary nodules are identified measuring up to maximum volume
derived equivalent diameter of 5.5 mm. No overtly suspicious nodule
or mass. No focal airspace consolidation. No pleural effusion.

Upper Abdomen: 2.1 cm low-density lesion in the left liver is likely
a cyst. Visualized upper abdomen otherwise unremarkable.

Musculoskeletal: No worrisome lytic or sclerotic osseous
abnormality.
IMPRESSION: 1. Lung-RADS 2, benign appearance or behavior. Continue annual
screening with low-dose chest CT without contrast in 12 months.
2. Ascending thoracic aortic aneurysm measuring 4.4 cm diameter.
Consensus guidelines recommend annual imaging follow-up. This could
be performed on yearly lung cancer screening CT. This recommendation
follows 4000 ACCF/AHA/AATS/ACR/ASA/SCA/BILLIOT/TAWNY/MIRTATOLO/CLAVEL Guidelines
for the Diagnosis and Management of Patients with Thoracic Aortic
Disease. Circulation. 4000; 121: E266-e369. Aortic aneurysm NOS
(SNLV0-YFY.Y)
3. Aortic Atherosclerosis (SNLV0-CMC.C) and Emphysema (SNLV0-WRI.T).

## 2021-11-13 ENCOUNTER — Ambulatory Visit: Payer: Self-pay | Admitting: Surgery

## 2021-11-20 ENCOUNTER — Encounter
Admission: RE | Admit: 2021-11-20 | Discharge: 2021-11-20 | Disposition: A | Discharge status: Home / Self Care | Payer: Medicare Other | Source: Ambulatory Visit | Attending: Surgery | Admitting: Surgery

## 2021-11-20 VITALS — Ht 72.0 in | Wt 220.0 lb

## 2021-11-20 DIAGNOSIS — I2510 Atherosclerotic heart disease of native coronary artery without angina pectoris: Secondary | ICD-10-CM

## 2021-11-20 DIAGNOSIS — Z01812 Encounter for preprocedural laboratory examination: Secondary | ICD-10-CM

## 2021-11-20 DIAGNOSIS — Z01818 Encounter for other preprocedural examination: Secondary | ICD-10-CM | POA: Diagnosis present

## 2021-11-20 LAB — BASIC METABOLIC PANEL
Anion gap: 7 (ref 5–15)
BUN: 16 mg/dL (ref 8–23)
CO2: 26 mmol/L (ref 22–32)
Calcium: 8.8 mg/dL — ABNORMAL LOW (ref 8.9–10.3)
Chloride: 103 mmol/L (ref 98–111)
Creatinine, Ser: 1.07 mg/dL (ref 0.61–1.24)
GFR, Estimated: >60 mL/min (ref >60)
Glucose, Bld: 89 mg/dL (ref 70–99)
Potassium: 3.7 mmol/L (ref 3.5–5.1)
Sodium: 136 mmol/L (ref 135–145)

## 2021-11-20 LAB — CBC
HCT: 42.3 % (ref 39.0–52.0)
Hemoglobin: 14.3 g/dL (ref 13.0–17.0)
MCH: 33.5 pg (ref 26.0–34.0)
MCHC: 33.8 g/dL (ref 30.0–36.0)
MCV: 99.1 fL (ref 80.0–100.0)
Platelets: 205 10*3/uL (ref 150–400)
RBC: 4.27 MIL/uL (ref 4.22–5.81)
RDW: 12.1 % (ref 11.5–15.5)
WBC: 8.1 10*3/uL (ref 4.0–10.5)
nRBC: 0 % (ref 0.0–0.2)

## 2021-11-20 NOTE — Patient Instructions (Addendum)
Your procedure is scheduled on: Thursday November 23, 2021. Report to Day Surgery inside [facility] 2nd floor, stop by admissions desk before getting on elevator.  To find out your arrival time please call [phone] between 1PM - 3PM on Wednesday November 22, 2021.  Remember: Instructions that are not followed completely may result in serious medical risk,  up to and including death, or upon the discretion of your surgeon and anesthesiologist your  surgery may need to be rescheduled.     _X__ 1. Do not eat food after midnight the night before your procedure.                 No chewing gum or hard candies. You may drink clear liquids up to 2 hours                 before you are scheduled to arrive for your surgery- DO not drink clear                 liquids within 2 hours of the start of your surgery.                 Clear Liquids include:  water, apple juice without pulp, clear Gatorade, G2 or                  Gatorade Zero (avoid Red/Purple/Blue), Black Coffee or Tea (Do not add                 anything to coffee or tea).  __X__2.  On the morning of surgery brush your teeth with toothpaste and water, you                may rinse your mouth with mouthwash if you wish.  Do not swallow any toothpaste or mouthwash.     _X__ 3.  No Alcohol for 24 hours before or after surgery.   _X__ 4.  Do Not Smoke or use e-cigarettes For 24 Hours Prior to Your Surgery.                 Do not use any chewable tobacco products for at least 6 hours prior to                 Surgery.  _X__  5.  Do not use any recreational drugs (marijuana, cocaine, heroin, ecstasy, MDMA or other)                For at least one week prior to your surgery.  Combination of these drugs with anesthesia                May have life threatening results.  ____  6.  Bring all medications with you on the day of surgery if instructed.   __X__  7.  Notify your doctor if there is any change in your medical  condition      (cold, fever, infections).     Do not wear jewelry, make-up, hairpins, clips or nail polish. Do not wear lotions, powders, or perfumes. You may wear deodorant. Do not shave 48 hours prior to surgery. Men may shave face and neck. Do not bring valuables to the hospital.    [facility] is not responsible for any belongings or valuables.  Contacts, dentures or bridgework may not be worn into surgery. Leave your suitcase in the car. After surgery it may be brought to your room. For patients admitted to the hospital, discharge time  is determined by your treatment team.   Patients discharged the day of surgery will not be allowed to drive home.   Make arrangements for someone to be with you for the first 24 hours of your Same Day Discharge.   __X__ Take these medicines the morning of surgery with A SIP OF WATER:    1. None   2.   3.   4.  5.  6.  ____ Fleet Enema (as directed)   __X__ Use CHG Soap (or wipes) as directed  ____ Use Benzoyl Peroxide Gel as instructed  ____ Use inhalers on the day of surgery  ____ Stop metformin 2 days prior to surgery    ____ Take 1/2 of usual insulin dose the night before surgery. No insulin the morning          of surgery.   ____ Call your PCP, cardiologist, or Pulmonologist if taking Coumadin/Plavix/aspirin and ask when to stop before your surgery.   __X__ One Week prior to surgery- Stop Anti-inflammatories such as Ibuprofen, Aleve, Advil, Motrin, meloxicam (MOBIC), diclofenac, etodolac, ketorolac, Toradol, Daypro, piroxicam, Goody's or BC powders. OK TO USE TYLENOL IF NEEDED   __X__ Stop supplements until after surgery.    ____ Bring C-Pap to the hospital.    If you have any questions regarding your pre-procedure instructions,  Please call Pre-admit Testing at [phone]

## 2021-11-23 ENCOUNTER — Other Ambulatory Visit: Payer: Self-pay

## 2021-11-23 ENCOUNTER — Ambulatory Visit
Admission: RE | Admit: 2021-11-23 | Discharge: 2021-11-23 | Disposition: A | Discharge status: Home / Self Care | Payer: Medicare Other | Attending: Surgery | Admitting: Surgery

## 2021-11-23 ENCOUNTER — Encounter: Payer: Self-pay | Admitting: Surgery

## 2021-11-23 ENCOUNTER — Ambulatory Visit: Payer: Medicare Other | Admitting: Anesthesiology

## 2021-11-23 ENCOUNTER — Ambulatory Visit: Payer: Medicare Other | Admitting: Urgent Care

## 2021-11-23 ENCOUNTER — Encounter
Admission: RE | Disposition: A | Discharge status: Home / Self Care | Payer: Self-pay | Source: Home / Self Care | Attending: Surgery

## 2021-11-23 DIAGNOSIS — K4091 Unilateral inguinal hernia, without obstruction or gangrene, recurrent: Secondary | ICD-10-CM | POA: Diagnosis present

## 2021-11-23 DIAGNOSIS — D176 Benign lipomatous neoplasm of spermatic cord: Secondary | ICD-10-CM | POA: Diagnosis not present

## 2021-11-23 DIAGNOSIS — Z87891 Personal history of nicotine dependence: Secondary | ICD-10-CM | POA: Insufficient documentation

## 2021-11-23 DIAGNOSIS — N400 Enlarged prostate without lower urinary tract symptoms: Secondary | ICD-10-CM | POA: Insufficient documentation

## 2021-11-23 HISTORY — PX: INSERTION OF MESH: SHX5868

## 2021-11-23 HISTORY — PX: INGUINAL HERNIA REPAIR: SHX194

## 2021-11-23 SURGERY — REPAIR, HERNIA, INGUINAL, ADULT
Anesthesia: General | Site: Inguinal | Laterality: Right

## 2021-11-23 MED ORDER — DOCUSATE SODIUM 100 MG PO CAPS: 100.0000 mg | ORAL_CAPSULE | Freq: Two times a day (BID) | ORAL | 0 refills | Status: AC | PRN

## 2021-11-23 MED ORDER — PHENYLEPHRINE 80 MCG/ML (10ML) SYRINGE FOR IV PUSH (FOR BLOOD PRESSURE SUPPORT)
PREFILLED_SYRINGE | INTRAVENOUS | Status: AC
  Filled 2021-11-23: qty 10

## 2021-11-23 MED ORDER — PHENYLEPHRINE HCL (PRESSORS) 10 MG/ML IV SOLN
INTRAVENOUS | Status: AC
  Filled 2021-11-23: qty 1

## 2021-11-23 MED ORDER — CEFAZOLIN SODIUM-DEXTROSE 2-4 GM/100ML-% IV SOLN
2.0000 g | INTRAVENOUS | Status: AC
  Administered 2021-11-23: 2 g via INTRAVENOUS

## 2021-11-23 MED ORDER — EPHEDRINE SULFATE (PRESSORS) 50 MG/ML IJ SOLN
INTRAMUSCULAR | Status: DC | PRN
  Administered 2021-11-23: 10 mg via INTRAVENOUS
  Administered 2021-11-23 (×2): 5 mg via INTRAVENOUS

## 2021-11-23 MED ORDER — BUPIVACAINE-MELOXICAM ER 200-6 MG/7ML IJ SOLN
INTRAMUSCULAR | Status: DC | PRN
  Administered 2021-11-23: 9 mL

## 2021-11-23 MED ORDER — PROPOFOL 10 MG/ML IV BOLUS
INTRAVENOUS | Status: AC
  Filled 2021-11-23: qty 20

## 2021-11-23 MED ORDER — DEXAMETHASONE SODIUM PHOSPHATE 10 MG/ML IJ SOLN
INTRAMUSCULAR | Status: DC | PRN
  Administered 2021-11-23: 5 mg via INTRAVENOUS

## 2021-11-23 MED ORDER — ACETAMINOPHEN 500 MG PO TABS: 1000.0000 mg | ORAL_TABLET | ORAL | Status: AC

## 2021-11-23 MED ORDER — CELECOXIB 200 MG PO CAPS
ORAL_CAPSULE | ORAL | Status: AC
  Administered 2021-11-23: 200 mg via ORAL
  Filled 2021-11-23: qty 1

## 2021-11-23 MED ORDER — ACETAMINOPHEN 325 MG PO TABS: 650.0000 mg | ORAL_TABLET | Freq: Three times a day (TID) | ORAL | 0 refills | Status: DC | PRN

## 2021-11-23 MED ORDER — ACETAMINOPHEN 325 MG PO TABS: 650.0000 mg | ORAL_TABLET | Freq: Three times a day (TID) | ORAL | 0 refills | Status: AC | PRN

## 2021-11-23 MED ORDER — ACETAMINOPHEN 500 MG PO TABS
ORAL_TABLET | ORAL | Status: AC
  Administered 2021-11-23: 1000 mg via ORAL
  Filled 2021-11-23: qty 2

## 2021-11-23 MED ORDER — MIDAZOLAM HCL 2 MG/2ML IJ SOLN
INTRAMUSCULAR | Status: DC | PRN
  Administered 2021-11-23: 2 mg via INTRAVENOUS

## 2021-11-23 MED ORDER — CHLORHEXIDINE GLUCONATE 0.12 % MT SOLN
OROMUCOSAL | Status: AC
  Administered 2021-11-23: 15 mL via OROMUCOSAL
  Filled 2021-11-23: qty 15

## 2021-11-23 MED ORDER — OXYCODONE-ACETAMINOPHEN 5-325 MG PO TABS: 1.0000 | ORAL_TABLET | Freq: Three times a day (TID) | ORAL | 0 refills | Status: DC | PRN

## 2021-11-23 MED ORDER — LIDOCAINE HCL (PF) 2 % IJ SOLN
INTRAMUSCULAR | Status: AC
  Filled 2021-11-23: qty 5

## 2021-11-23 MED ORDER — ROCURONIUM BROMIDE 100 MG/10ML IV SOLN
INTRAVENOUS | Status: DC | PRN
  Administered 2021-11-23: 40 mg via INTRAVENOUS

## 2021-11-23 MED ORDER — DOCUSATE SODIUM 100 MG PO CAPS: 100.0000 mg | ORAL_CAPSULE | Freq: Two times a day (BID) | ORAL | 0 refills | Status: DC | PRN

## 2021-11-23 MED ORDER — FENTANYL CITRATE (PF) 100 MCG/2ML IJ SOLN
INTRAMUSCULAR | Status: AC
  Administered 2021-11-23: 50 ug via INTRAVENOUS
  Filled 2021-11-23: qty 2

## 2021-11-23 MED ORDER — MIDAZOLAM HCL 2 MG/2ML IJ SOLN
INTRAMUSCULAR | Status: AC
  Filled 2021-11-23: qty 2

## 2021-11-23 MED ORDER — CHLORHEXIDINE GLUCONATE CLOTH 2 % EX PADS: 6.0000 | MEDICATED_PAD | Freq: Once | CUTANEOUS | Status: DC

## 2021-11-23 MED ORDER — ORAL CARE MOUTH RINSE: 15.0000 mL | Freq: Once | OROMUCOSAL | Status: AC

## 2021-11-23 MED ORDER — DEXAMETHASONE SODIUM PHOSPHATE 10 MG/ML IJ SOLN
INTRAMUSCULAR | Status: AC
  Filled 2021-11-23: qty 1

## 2021-11-23 MED ORDER — FENTANYL CITRATE (PF) 100 MCG/2ML IJ SOLN
INTRAMUSCULAR | Status: AC
  Administered 2021-11-23: 25 ug via INTRAVENOUS
  Filled 2021-11-23: qty 2

## 2021-11-23 MED ORDER — ONDANSETRON HCL 4 MG/2ML IJ SOLN
INTRAMUSCULAR | Status: AC
  Filled 2021-11-23: qty 2

## 2021-11-23 MED ORDER — 0.9 % SODIUM CHLORIDE (POUR BTL) OPTIME
TOPICAL | Status: DC | PRN
  Administered 2021-11-23: 500 mL

## 2021-11-23 MED ORDER — FENTANYL CITRATE (PF) 100 MCG/2ML IJ SOLN
25.0000 ug | INTRAMUSCULAR | Status: DC | PRN
  Administered 2021-11-23 (×3): 25 ug via INTRAVENOUS

## 2021-11-23 MED ORDER — BUPIVACAINE LIPOSOME 1.3 % IJ SUSP
INTRAMUSCULAR | Status: AC
  Filled 2021-11-23: qty 20

## 2021-11-23 MED ORDER — BUPIVACAINE-EPINEPHRINE (PF) 0.5% -1:200000 IJ SOLN
INTRAMUSCULAR | Status: AC
Start: 2021-11-23 — End: ?
  Filled 2021-11-23: qty 30

## 2021-11-23 MED ORDER — CELECOXIB 200 MG PO CAPS: 200.0000 mg | ORAL_CAPSULE | ORAL | Status: AC

## 2021-11-23 MED ORDER — SEVOFLURANE IN SOLN
RESPIRATORY_TRACT | Status: AC
  Filled 2021-11-23: qty 250

## 2021-11-23 MED ORDER — FAMOTIDINE 20 MG PO TABS
ORAL_TABLET | ORAL | Status: AC
  Administered 2021-11-23: 20 mg via ORAL
  Filled 2021-11-23: qty 1

## 2021-11-23 MED ORDER — LIDOCAINE HCL (CARDIAC) PF 100 MG/5ML IV SOSY
PREFILLED_SYRINGE | INTRAVENOUS | Status: DC | PRN
  Administered 2021-11-23: 100 mg via INTRAVENOUS

## 2021-11-23 MED ORDER — OXYCODONE HCL 5 MG PO TABS
ORAL_TABLET | ORAL | Status: AC
  Administered 2021-11-23: 5 mg via ORAL
  Filled 2021-11-23: qty 1

## 2021-11-23 MED ORDER — CEFAZOLIN SODIUM-DEXTROSE 2-4 GM/100ML-% IV SOLN
INTRAVENOUS | Status: AC
  Filled 2021-11-23: qty 100

## 2021-11-23 MED ORDER — BUPIVACAINE-MELOXICAM ER 200-6 MG/7ML IJ SOLN
INTRAMUSCULAR | Status: AC
  Filled 2021-11-23: qty 1

## 2021-11-23 MED ORDER — LACTATED RINGERS IV SOLN: INTRAVENOUS | Status: DC

## 2021-11-23 MED ORDER — OXYCODONE HCL 5 MG PO TABS: 5.0000 mg | ORAL_TABLET | Freq: Once | ORAL | Status: AC | PRN

## 2021-11-23 MED ORDER — GABAPENTIN 300 MG PO CAPS: 300.0000 mg | ORAL_CAPSULE | ORAL | Status: AC

## 2021-11-23 MED ORDER — FENTANYL CITRATE (PF) 100 MCG/2ML IJ SOLN
INTRAMUSCULAR | Status: DC | PRN
Start: 2021-11-23 — End: 2021-11-23
  Administered 2021-11-23: 50 ug via INTRAVENOUS
  Administered 2021-11-23 (×2): 25 ug via INTRAVENOUS

## 2021-11-23 MED ORDER — FENTANYL CITRATE (PF) 100 MCG/2ML IJ SOLN
INTRAMUSCULAR | Status: AC
  Filled 2021-11-23: qty 2

## 2021-11-23 MED ORDER — ONDANSETRON HCL 4 MG/2ML IJ SOLN: 4.0000 mg | Freq: Once | INTRAMUSCULAR | Status: DC | PRN

## 2021-11-23 MED ORDER — OXYCODONE-ACETAMINOPHEN 5-325 MG PO TABS: 1.0000 | ORAL_TABLET | Freq: Three times a day (TID) | ORAL | 0 refills | Status: AC | PRN

## 2021-11-23 MED ORDER — GABAPENTIN 300 MG PO CAPS
ORAL_CAPSULE | ORAL | Status: AC
  Administered 2021-11-23: 300 mg via ORAL
  Filled 2021-11-23: qty 1

## 2021-11-23 MED ORDER — CHLORHEXIDINE GLUCONATE 0.12 % MT SOLN: 15.0000 mL | Freq: Once | OROMUCOSAL | Status: AC

## 2021-11-23 MED ORDER — OXYCODONE HCL 5 MG/5ML PO SOLN: 5.0000 mg | Freq: Once | ORAL | Status: AC | PRN

## 2021-11-23 MED ORDER — ROCURONIUM BROMIDE 10 MG/ML (PF) SYRINGE
PREFILLED_SYRINGE | INTRAVENOUS | Status: AC
  Filled 2021-11-23: qty 10

## 2021-11-23 MED ORDER — ACETAMINOPHEN 10 MG/ML IV SOLN: 1000.0000 mg | Freq: Once | INTRAVENOUS | Status: DC | PRN

## 2021-11-23 MED ORDER — ONDANSETRON HCL 4 MG/2ML IJ SOLN
INTRAMUSCULAR | Status: DC | PRN
  Administered 2021-11-23: 4 mg via INTRAVENOUS

## 2021-11-23 MED ORDER — PROPOFOL 10 MG/ML IV BOLUS
INTRAVENOUS | Status: DC | PRN
  Administered 2021-11-23: 100 mg via INTRAVENOUS

## 2021-11-23 MED ORDER — PHENYLEPHRINE HCL (PRESSORS) 10 MG/ML IV SOLN
INTRAVENOUS | Status: DC | PRN
  Administered 2021-11-23 (×6): 80 ug via INTRAVENOUS

## 2021-11-23 MED ORDER — FAMOTIDINE 20 MG PO TABS: 20.0000 mg | ORAL_TABLET | Freq: Once | ORAL | Status: AC

## 2021-11-23 SURGICAL SUPPLY — 43 items
ADH SKN CLS APL DERMABOND .7 (GAUZE/BANDAGES/DRESSINGS) ×2
APL PRP STRL LF DISP 70% ISPRP (MISCELLANEOUS) ×2
BLADE CLIPPER SURG (BLADE) ×2 IMPLANT
BLADE SURG 15 STRL LF DISP TIS (BLADE) ×2 IMPLANT
BLADE SURG 15 STRL SS (BLADE) ×2
CHLORAPREP W/TINT 26 (MISCELLANEOUS) ×2 IMPLANT
DERMABOND ADVANCED (GAUZE/BANDAGES/DRESSINGS) ×2
DERMABOND ADVANCED .7 DNX12 (GAUZE/BANDAGES/DRESSINGS) ×2 IMPLANT
DRAIN PENROSE 12X.25 LTX STRL (MISCELLANEOUS) ×2 IMPLANT
DRAPE LAPAROTOMY 100X77 ABD (DRAPES) ×2 IMPLANT
ELECT REM PT RETURN 9FT ADLT (ELECTROSURGICAL) ×2
ELECTRODE REM PT RTRN 9FT ADLT (ELECTROSURGICAL) ×2 IMPLANT
GAUZE 4X4 16PLY ~~LOC~~+RFID DBL (SPONGE) ×2 IMPLANT
GLOVE BIOGEL PI IND STRL 7.0 (GLOVE) ×2 IMPLANT
GLOVE BIOGEL PI INDICATOR 7.0 (GLOVE) ×4
GLOVE SURG SYN 6.5 ES PF (GLOVE) ×8 IMPLANT
GLOVE SURG SYN 6.5 PF PI (GLOVE) ×4 IMPLANT
GOWN STRL REUS W/ TWL LRG LVL3 (GOWN DISPOSABLE) ×4 IMPLANT
GOWN STRL REUS W/TWL LRG LVL3 (GOWN DISPOSABLE) ×4
LABEL OR SOLS (LABEL) ×2 IMPLANT
MANIFOLD NEPTUNE II (INSTRUMENTS) ×2 IMPLANT
MESH MARLEX PLUG MEDIUM (Mesh General) IMPLANT
NEEDLE HYPO 22GX1.5 SAFETY (NEEDLE) ×4 IMPLANT
NS IRRIG 500ML POUR BTL (IV SOLUTION) ×2 IMPLANT
PACK BASIN MINOR ARMC (MISCELLANEOUS) ×2 IMPLANT
SUT ETHIBOND NAB MO 7 #0 18IN (SUTURE) ×2 IMPLANT
SUT MNCRL 4-0 (SUTURE) ×2
SUT MNCRL 4-0 27XMFL (SUTURE) ×2
SUT SILK 3 0 (SUTURE) ×2
SUT SILK 3 0 12 30 (SUTURE) IMPLANT
SUT SILK 3 0 SH 30 (SUTURE) IMPLANT
SUT SILK 3-0 18XBRD TIE 12 (SUTURE) IMPLANT
SUT VIC AB 2-0 CT2 27 (SUTURE) ×2 IMPLANT
SUT VIC AB 3-0 SH 27 (SUTURE) ×2
SUT VIC AB 3-0 SH 27X BRD (SUTURE) ×2 IMPLANT
SUTURE MNCRL 4-0 27XMF (SUTURE) ×2 IMPLANT
SYR 10ML LL (SYRINGE) ×2 IMPLANT
SYR 20ML LL LF (SYRINGE) ×2 IMPLANT
SYR BULB IRRIG 60ML STRL (SYRINGE) ×2 IMPLANT
TOWEL OR 17X26 4PK STRL BLUE (TOWEL DISPOSABLE) ×2 IMPLANT
TRAP FLUID SMOKE EVACUATOR (MISCELLANEOUS) ×2 IMPLANT
WATER STERILE IRR 1000ML POUR (IV SOLUTION) ×2 IMPLANT
WATER STERILE IRR 500ML POUR (IV SOLUTION) ×2 IMPLANT

## 2021-11-23 NOTE — Anesthesia Procedure Notes (Signed)
Procedure Name: Intubation Date/Time: 11/23/2021 1:52 PM  Performed by: Cammie Sickle, CRNAPre-anesthesia Checklist: Patient identified, Patient being monitored, Timeout performed, Emergency Drugs available and Suction available Patient Re-evaluated:Patient Re-evaluated prior to induction Oxygen Delivery Method: Circle system utilized Preoxygenation: Pre-oxygenation with 100% oxygen Induction Type: IV induction Ventilation: Mask ventilation without difficulty Laryngoscope Size: 3 and McGraph Grade View: Grade I Tube type: Oral Tube size: 7.5 mm Number of attempts: 1 Airway Equipment and Method: Stylet Placement Confirmation: ETT inserted through vocal cords under direct vision, positive ETCO2 and breath sounds checked- equal and bilateral Secured at: 22 cm Tube secured with: Tape Dental Injury: Teeth and Oropharynx as per pre-operative assessment

## 2021-11-23 NOTE — Transfer of Care (Signed)
Immediate Anesthesia Transfer of Care Note  Patient: Stuart Wise.  Procedure(s) Performed: HERNIA REPAIR INGUINAL ADULT (Right: Groin) INSERTION OF MESH (Right: Inguinal)  Patient Location: PACU  Anesthesia Type:General  Level of Consciousness: drowsy  Airway & Oxygen Therapy: Patient Spontanous Breathing and Patient connected to face mask oxygen  Post-op Assessment: Report given to RN and Post -op Vital signs reviewed and stable  Post vital signs: Reviewed and stable  Last Vitals:  Vitals Value Taken Time  BP 127/80   Temp    Pulse 84 11/23/21 1711  Resp 13 11/23/21 1711  SpO2 98 % 11/23/21 1711  Vitals shown include unvalidated device data.  Last Pain:  Vitals:   11/23/21 1202  TempSrc: Oral  PainSc: 6          Complications: No notable events documented.

## 2021-11-23 NOTE — Interval H&P Note (Deleted)
History and Physical Interval Note:  11/23/2021 12:35 PM  Stuart Wise.  has presented today for surgery, with the diagnosis of Non-recurrent unilateral inguinal hernia without obstruction or gangrene K40.90.  The various methods of treatment have been discussed with the patient and family. After consideration of risks, benefits and other options for treatment, the patient has consented to  Procedure(s): HERNIA REPAIR INGUINAL ADULT (Right) as a surgical intervention.  The patient's history has been reviewed, patient examined, no change in status, stable for surgery.  I have reviewed the patient's chart and labs.  Questions were answered to the patient's satisfaction.     [name] Lysle Pearl

## 2021-11-23 NOTE — Discharge Instructions (Addendum)
Hernia repair, Care After This sheet gives you information about how to care for yourself after your procedure. Your health care provider may also give you more specific instructions. If you have problems or questions, contact your health care provider. What can I expect after the procedure? After your procedure, it is common to have the following: Pain in your abdomen, especially in the incision areas. You will be given medicine to control the pain. Tiredness. This is a normal part of the recovery process. Your energy level will return to normal over the next several weeks. Changes in your bowel movements, such as constipation or needing to go more often. Talk with your health care provider about how to manage this. Follow these instructions at home: Medicines  tylenol and advil as needed for discomfort.  Please alternate between the two every four hours as needed for pain.    Use narcotics, if prescribed, only when tylenol and motrin is not enough to control pain.  325-650mg every 8hrs to max of 3000mg/24hrs (including the 325mg in every norco dose) for the tylenol.    Advil up to 800mg per dose every 8hrs as needed for pain.   PLEASE RECORD NUMBER OF PILLS TAKEN UNTIL NEXT FOLLOW UP APPT.  THIS WILL HELP DETERMINE HOW READY YOU ARE TO BE RELEASED FROM ANY ACTIVITY RESTRICTIONS Do not drive or use heavy machinery while taking prescription pain medicine. Do not drink alcohol while taking prescription pain medicine.  Incision care    Follow instructions from your health care provider about how to take care of your incision areas. Make sure you: Keep your incisions clean and dry. Wash your hands with soap and water before and after applying medicine to the areas, and before and after changing your bandage (dressing). If soap and water are not available, use hand sanitizer. Change your dressing as told by your health care provider. Leave stitches (sutures), skin glue, or adhesive strips in  place. These skin closures may need to stay in place for 2 weeks or longer. If adhesive strip edges start to loosen and curl up, you may trim the loose edges. Do not remove adhesive strips completely unless your health care provider tells you to do that. Do not wear tight clothing over the incisions. Tight clothing may rub and irritate the incision areas, which may cause the incisions to open. Do not take baths, swim, or use a hot tub until your health care provider approves. OK TO SHOWER IN 24HRS.   Check your incision area every day for signs of infection. Check for: More redness, swelling, or pain. More fluid or blood. Warmth. Pus or a bad smell. Activity Avoid lifting anything that is heavier than 10 lb (4.5 kg) for 2 weeks or until your health care provider says it is okay. No pushing/pulling greater than 30lbs You may resume normal activities as told by your health care provider. Ask your health care provider what activities are safe for you. Take rest breaks during the day as needed. Eating and drinking Follow instructions from your health care provider about what you can eat after surgery. To prevent or treat constipation while you are taking prescription pain medicine, your health care provider may recommend that you: Drink enough fluid to keep your urine clear or pale yellow. Take over-the-counter or prescription medicines. Eat foods that are high in fiber, such as fresh fruits and vegetables, whole grains, and beans. Limit foods that are high in fat and processed sugars, such as fried and   sweet foods. General instructions Ask your health care provider when you will need an appointment to get your sutures or staples removed. Keep all follow-up visits as told by your health care provider. This is important. Contact a health care provider if: You have more redness, swelling, or pain around your incisions. You have more fluid or blood coming from the incisions. Your incisions feel  warm to the touch. You have pus or a bad smell coming from your incisions or your dressing. You have a fever. You have an incision that breaks open (edges not staying together) after sutures or staples have been removed. You develop a rash. You have chest pain or difficulty breathing. You have pain or swelling in your legs. You feel light-headed or you faint. Your abdomen swells (becomes distended). You have nausea or vomiting. You have blood in your stool (feces). This information is not intended to replace advice given to you by your health care provider. Make sure you discuss any questions you have with your health care provider. Document Released: 10/06/2004 Document Revised: 12/06/2017 Document Reviewed: 12/19/2015 Elsevier Interactive Patient Education  2019 [employer]   AMBULATORY SURGERY  DISCHARGE INSTRUCTIONS   The drugs that you were given will stay in your system until tomorrow so for the next 24 hours you should not:  Drive an automobile Make any legal decisions Drink any alcoholic beverage   You may resume regular meals tomorrow.  Today it is better to start with liquids and gradually work up to solid foods.  You may eat anything you prefer, but it is better to start with liquids, then soup and crackers, and gradually work up to solid foods.   Please notify your doctor immediately if you have any unusual bleeding, trouble breathing, redness and pain at the surgery site, drainage, fever, or pain not relieved by medication.    Additional Instructions:        Please contact your physician with any problems or Same Day Surgery at [phone], Monday through Friday 6 am to 4 pm, or [place] [facility] [place] Main number at [phone].  

## 2021-11-23 NOTE — Anesthesia Postprocedure Evaluation (Signed)
Anesthesia Post Note  Patient: Stuart Wise.  Procedure(s) Performed: HERNIA REPAIR INGUINAL ADULT (Right: Groin) INSERTION OF MESH (Right: Inguinal)  Patient location during evaluation: PACU Anesthesia Type: General Level of consciousness: awake and alert Pain management: pain level controlled Vital Signs Assessment: post-procedure vital signs reviewed and stable Respiratory status: spontaneous breathing, nonlabored ventilation and respiratory function stable Cardiovascular status: blood pressure returned to baseline and stable Postop Assessment: no apparent nausea or vomiting Anesthetic complications: no   No notable events documented.   Last Vitals:  Vitals:   11/23/21 1750 11/23/21 1807  BP: 117/70 125/66  Pulse: 73 83  Resp: 17   Temp:  (!) 36.3 C  SpO2: 95% 98%    Last Pain:  Vitals:   11/23/21 1807  TempSrc: Temporal  PainSc: [place]

## 2021-11-23 NOTE — Anesthesia Preprocedure Evaluation (Addendum)
Anesthesia Evaluation  Patient identified by MRN, date of birth, ID band Patient awake    Reviewed: Allergy & Precautions, NPO status , Patient's Chart, lab work & pertinent test results  History of Anesthesia Complications Negative for: history of anesthetic complications  Airway Mallampati: II  TM Distance: >3 FB Neck ROM: Full    Dental  (+) Missing   Pulmonary former smoker (quit 2007),    Pulmonary exam normal breath sounds clear to auscultation       Cardiovascular Exercise Tolerance: Good negative cardio ROS Normal cardiovascular exam Rhythm:Regular Rate:Normal  ECG 11/20/21:  Sinus rhythm with marked sinus arrhythmia Otherwise normal ECG   Neuro/Psych PSYCHIATRIC DISORDERS Anxiety Depression negative neurological ROS     GI/Hepatic negative GI ROS,   Endo/Other  negative endocrine ROS  Renal/GU negative Renal ROS   BPH    Musculoskeletal  (+) Arthritis ,   Abdominal   Peds  Hematology negative hematology ROS (+)   Anesthesia Other Findings   Reproductive/Obstetrics                          Anesthesia Physical Anesthesia Plan  ASA: 2  Anesthesia Plan: General   Post-op Pain Management:    Induction: Intravenous  PONV Risk Score and Plan: 2 and Ondansetron, Dexamethasone and Treatment may vary due to age or medical condition  Airway Management Planned: Oral ETT  Additional Equipment:   Intra-op Plan:   Post-operative Plan: Extubation in OR  Informed Consent: I have reviewed the patients History and Physical, chart, labs and discussed the procedure including the risks, benefits and alternatives for the proposed anesthesia with the patient or authorized representative who has indicated his/her understanding and acceptance.     Dental advisory given  Plan Discussed with: CRNA  Anesthesia Plan Comments: (Patient consented for risks of anesthesia including but not  limited to:  - adverse reactions to medications - damage to eyes, teeth, lips or other oral mucosa - nerve damage due to positioning  - sore throat or hoarseness - damage to heart, brain, nerves, lungs, other parts of body or loss of life  Informed patient about role of CRNA in peri- and intra-operative care.  Patient voiced understanding.)      Anesthesia Quick Evaluation

## 2021-11-23 NOTE — H&P (Signed)
Subjective:   CC: Non-recurrent unilateral inguinal hernia without obstruction or gangrene [K40.90]  HPI: Stuart Wise is a 71 y.o. male who returns for evaluation of above. Symptoms were first noted a few months ago. Pain is dull and discomfort, confined to the right groin, without radiation. Associated with lump, exacerbated by nothing. Lump is reducible.   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  Past Surgical History:  Past Surgical History:  Procedure Laterality Date  HERNIA REPAIR 1994  left inguinal herniorrhaphy  COLONOSCOPY 10/26/2002  Int Hemorrhoids  COLONOSCOPY 05/15/2011  PH Adenomatous Polyps: CBF 05/2016; Recall Ltr mailed 03/23/2016 (dw); OV made 06/22/2016 @ 10am w/Kim Jerelene Redden NP (dw)  COLONOSCOPY 09/05/2016  PH Adenomatous Polyps: CBF 08/2021  COLONOSCOPY 02/07/2010, 12/26/2004  Adenomatous Polyp  LAPAROSCOPIC INGUINAL HERNIA REPAIR Right  inguinal and femoral robotic assisted by Agustine Rossitto  prostate bx   Family History: family history includes Coronary Artery Disease (Blocked arteries around heart) in his father; Depression in his son.  Social History: reports that he quit smoking about 15 years ago. His smoking use included cigarettes. He has a 37.50 pack-year smoking history. He has never used smokeless tobacco. He reports current alcohol use. He reports that he does not use drugs.  Current Medications: has a current medication list which includes the  following prescription(s): atorvastatin, loratadine, multivitamin with minerals, sildenafil, and tamsulosin.  Allergies:  Allergies as of 11/13/2021 - Reviewed 11/13/2021  Allergen Reaction Noted  Prednisone Unknown 12/25/2013   ROS:  A 15 point review of systems was performed and pertinent positives and negatives noted in HPI  Objective:    BP (!) 145/81  Pulse 84  Ht 180.3 cm ('5\' 11"'$ )  Wt 99.8 kg (220 lb)  BMI 30.68 kg/m   Constitutional : Alert, cooperative, no distress  Lymphatics/Throat: Supple, no lymphadenopathy  Respiratory: clear to auscultation bilaterally  Cardiovascular: regular rate and rhythm  Gastrointestinal: soft, non-tender; bowel sounds normal; no masses, no organomegaly. inguinal hernia noted. moderate, reducible, no overlying skin changes, and RIGHT, recurrent  Musculoskeletal: Steady gait and movement  Skin: Cool and moist, port site surgical scars  Psychiatric: Normal affect, non-agitated, not confused    LABS:  N/a   RADS: N/a Assessment:    Non-recurrent unilateral inguinal hernia without obstruction or gangrene [K40.90], RIGHT  Plan:    1. Non-recurrent unilateral inguinal hernia without obstruction or gangrene [K40.90]  Discussed the risk of surgery including recurrence, which can be up to 50% in the case of incisional or complex hernias, possible use of prosthetic materials (mesh) and the increased risk of mesh infxn if used, bleeding, chronic pain, post-op infxn, post-op SBO or ileus, and possible re-operation to address said risks. The risks of general anesthetic, if used, includes MI, CVA, sudden death or even reaction to anesthetic medications also discussed. Alternatives include continued observation. Benefits include possible symptom relief, prevention of incarceration, strangulation, enlargement in size over time, and the risk of emergency surgery in the face of strangulation.   Typical post-op recovery time of 3-5 days with 2 weeks of  activity restrictions were also discussed.  ED return precautions given for  sudden increase in pain, size of hernia with accompanying fever, nausea, and/or vomiting.  The patient verbalized understanding and all questions were answered to the patient's satisfaction.  2. Patient has elected to proceed with surgical treatment. Procedure will be scheduled. RIGHT, open due to recurrent nature.  labs/images/medications/previous chart entries reviewed personally and relevant changes/updates noted above.

## 2021-11-24 ENCOUNTER — Encounter: Payer: Self-pay | Admitting: Surgery

## 2021-11-24 NOTE — Op Note (Addendum)
Preoperative diagnosis: right recurrent reducible Inguinal Hernia.  Postoperative diagnosis: same  Inguinal Hernia  Procedure:  Open recurrent righ Inguinal hernia repair with mesh  Anesthesia: General, LMA  Surgeon: Dr. Lysle Pearl Assistant: Cintron for exposure and additional retraction  Wound Classification: Clean  Specimen: none  Complications: None  Estimated Blood Loss: 72m   Indications:  Patient is a 71y.o. male developed a symptomatic recurrent right inguinal hernia. Repair was indicated to avoid complications of incarceration, obstruction and pain, and a prosthetic mesh repair was elected.  Findings: 1. Vas Deferens and cord structures identified and preserved 2. Bard plug and patch 3. Adequate hemostasis achieved  Description of procedure: The patient was taken to the operating room. A time-out was completed verifying correct patient, procedure, site, positioning, and implant(s) and/or special equipment prior to beginning this procedure. The right groin was prepped and draped in the usual sterile fashion. An incision was marked in a natural skin crease and planned to end near the pubic tubercle.  The skin crease incision was made with a knife and deepened through very thick Scarpa's and Camper's fascia with electrocautery until the aponeurosis of the external oblique was encountered. This was cleaned and the external ring was exposed. Hemostasis was achieved in the wound. An incision was made in the midportion of the external oblique aponeurosis in the direction of its fibers. The ilioinguinal nerve was identified and protected throughout the dissection. Flaps of the external oblique were developed cephalad and inferiorly.  The cord was identified. It was gently dissected free at the pubic tubercle and encircled with a Penrose drain. Attention was directed to the anteromedial aspect of the cord, where very large indirect hernia sac was identified. The sac was carefully dissected  free of the cord down to the level of the internal ring after much difficulty due to size and scarring, and reduced back into abdominal cavity along with an adjacent cord lipoma. Doppler used to confirm no audible flow to the transected cord lipomas and other tissue prior to ligation and reduction. The vas and testicular vessels were eventually identified and protected from harm.   Attention then turned to the floor of the canal, which was intact but with a very large internal ring.  Plug used to fill the gaping internal ring, secured to edges using 0 ethibond.  The patch was then placed over the plug and reinforced the floor, secured circumfrentially with interrupted 0 ethibond.  Care was taken to assure that the mesh was placed flat against the floor and wrapped loosely around the cord structures.  Hemostasis was again checked. The Penrose drain was removed. Zynreled infused within operative field  The external oblique aponeurosis was closed with a running suture of 2-0 Vicryl, taking care not to catch the ilioinguinal nerve in the suture line. Scarpa's fascia was closed with interrupted 3-0 Vicryl.  The deep dermal layer closed with interrupted 3-0 Vicryl.  The skin was closed with a subcuticular stitch of Monocryl 4-0. Dermabond was applied.  The testis was gently pulled down into its anatomic position in the scrotum.  The patient tolerated the procedure well and was taken to the postanesthesia care unit in stable condition. Sponge and instrument count correct at end of procedure.

## 2021-11-24 NOTE — H&P (Signed)
CC: recurrent inguinal hernia  HPI: Stuart Wise is a 71 y.o. male who returns for evaluation of above. Symptoms were first noted a few months ago. Pain is dull and discomfort, confined to the right groin, without radiation. Associated with lump, exacerbated by nothing. Lump is reducible.   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  Past Surgical History:  Past Surgical History:  Procedure Laterality Date  HERNIA REPAIR 1994  left inguinal herniorrhaphy  COLONOSCOPY 10/26/2002  Int Hemorrhoids  COLONOSCOPY 05/15/2011  PH Adenomatous Polyps: CBF 05/2016; Recall Ltr mailed 03/23/2016 (dw); OV made 06/22/2016 @ 10am w/Kim Jerelene Redden NP (dw)  COLONOSCOPY 09/05/2016  PH Adenomatous Polyps: CBF 08/2021  COLONOSCOPY 02/07/2010, 12/26/2004  Adenomatous Polyp  LAPAROSCOPIC INGUINAL HERNIA REPAIR Right  inguinal and femoral robotic assisted by Sharicka Pogorzelski  prostate bx   Family History: family history includes Coronary Artery Disease (Blocked arteries around heart) in his father; Depression in his son.  Social History: reports that he quit smoking about 15 years ago. His smoking use included cigarettes. He has a 37.50 pack-year smoking history. He has never used smokeless tobacco. He reports current alcohol use. He reports that he does not use drugs.  Current Medications: has a current medication list which includes the following prescription(s): atorvastatin, loratadine, multivitamin with  minerals, sildenafil, and tamsulosin.  Allergies:  Allergies as of 11/13/2021 - Reviewed 11/13/2021  Allergen Reaction Noted  Prednisone Unknown 12/25/2013   ROS:  A 15 point review of systems was performed and pertinent positives and negatives noted in HPI  Objective:    BP (!) 145/81  Pulse 84  Ht 180.3 cm ('5\' 11"'$ )  Wt 99.8 kg (220 lb)  BMI 30.68 kg/m   Constitutional : Alert, cooperative, no distress  Lymphatics/Throat: Supple, no lymphadenopathy  Respiratory: clear to auscultation bilaterally  Cardiovascular: regular rate and rhythm  Gastrointestinal: soft, non-tender; bowel sounds normal; no masses, no organomegaly. inguinal hernia noted. moderate, reducible, no overlying skin changes, and RIGHT, recurrent  Musculoskeletal: Steady gait and movement  Skin: Cool and moist, port site surgical scars  Psychiatric: Normal affect, non-agitated, not confused    LABS:  N/a   RADS: N/a Assessment:    Recurrent inguinal hernia RIGHT  Plan:    1. Non-recurrent unilateral inguinal hernia without obstruction or gangrene [K40.90]  Discussed the risk of surgery including recurrence, which can be up to 50% in the case of incisional or complex hernias, possible use of prosthetic materials (mesh) and the increased risk of mesh infxn if used, bleeding, chronic pain, post-op infxn, post-op SBO or ileus, and possible re-operation to address said risks. The risks of general anesthetic, if used, includes MI, CVA, sudden death or even reaction to anesthetic medications also discussed. Alternatives include continued observation. Benefits include possible symptom relief, prevention of incarceration, strangulation, enlargement in size over time, and the risk of emergency surgery in the face of strangulation.   Typical post-op recovery time of 3-5 days with 2 weeks of activity restrictions were also discussed.  ED return precautions given for sudden increase in pain, size of hernia with  accompanying fever, nausea, and/or vomiting.  The  patient verbalized understanding and all questions were answered to the patient's satisfaction.  2. Patient has elected to proceed with surgical treatment. Procedure will be scheduled. RIGHT, open due to recurrent nature.  labs/images/medications/previous chart entries reviewed personally and relevant changes/updates noted above.

## 2021-11-25 ENCOUNTER — Other Ambulatory Visit: Payer: Self-pay

## 2021-11-27 LAB — SURGICAL PATHOLOGY

## 2022-05-30 ENCOUNTER — Ambulatory Visit
Admission: RE | Admit: 2022-05-30 | Discharge: 2022-05-30 | Disposition: A | Discharge status: Home / Self Care | Payer: Medicare Other | Attending: Internal Medicine | Admitting: Internal Medicine

## 2022-05-30 ENCOUNTER — Ambulatory Visit: Payer: Medicare Other | Admitting: Certified Registered"

## 2022-05-30 ENCOUNTER — Other Ambulatory Visit: Payer: Self-pay

## 2022-05-30 ENCOUNTER — Encounter
Admission: RE | Disposition: A | Discharge status: Home / Self Care | Payer: Self-pay | Source: Home / Self Care | Attending: Internal Medicine

## 2022-05-30 ENCOUNTER — Encounter: Payer: Self-pay | Admitting: Internal Medicine

## 2022-05-30 DIAGNOSIS — Z1211 Encounter for screening for malignant neoplasm of colon: Secondary | ICD-10-CM | POA: Diagnosis present

## 2022-05-30 DIAGNOSIS — D122 Benign neoplasm of ascending colon: Secondary | ICD-10-CM | POA: Diagnosis not present

## 2022-05-30 DIAGNOSIS — K640 First degree hemorrhoids: Secondary | ICD-10-CM | POA: Insufficient documentation

## 2022-05-30 DIAGNOSIS — K5730 Diverticulosis of large intestine without perforation or abscess without bleeding: Secondary | ICD-10-CM | POA: Insufficient documentation

## 2022-05-30 DIAGNOSIS — Z87891 Personal history of nicotine dependence: Secondary | ICD-10-CM | POA: Diagnosis not present

## 2022-05-30 HISTORY — PX: COLONOSCOPY: SHX5424

## 2022-05-30 SURGERY — COLONOSCOPY
Anesthesia: General

## 2022-05-30 MED ORDER — PROPOFOL 500 MG/50ML IV EMUL
INTRAVENOUS | Status: DC | PRN
  Administered 2022-05-30: 165 ug/kg/min via INTRAVENOUS

## 2022-05-30 MED ORDER — LIDOCAINE HCL (CARDIAC) PF 100 MG/5ML IV SOSY
PREFILLED_SYRINGE | INTRAVENOUS | Status: DC | PRN
  Administered 2022-05-30: 100 mg via INTRAVENOUS

## 2022-05-30 MED ORDER — STERILE WATER FOR IRRIGATION IR SOLN
Status: DC | PRN
  Administered 2022-05-30: 120 mL

## 2022-05-30 MED ORDER — SODIUM CHLORIDE 0.9 % IV SOLN: INTRAVENOUS | Status: DC

## 2022-05-30 MED ORDER — PROPOFOL 10 MG/ML IV BOLUS
INTRAVENOUS | Status: DC | PRN
  Administered 2022-05-30: 20 mg via INTRAVENOUS
  Administered 2022-05-30: 30 mg via INTRAVENOUS
  Administered 2022-05-30: 70 mg via INTRAVENOUS

## 2022-05-30 MED ORDER — PHENYLEPHRINE 80 MCG/ML (10ML) SYRINGE FOR IV PUSH (FOR BLOOD PRESSURE SUPPORT)
PREFILLED_SYRINGE | INTRAVENOUS | Status: DC | PRN
  Administered 2022-05-30 (×2): 160 ug via INTRAVENOUS

## 2022-05-30 NOTE — Anesthesia Preprocedure Evaluation (Signed)
Anesthesia Evaluation  Patient identified by MRN, date of birth, ID band Patient awake    Reviewed: Allergy & Precautions, NPO status , Patient's Chart, lab work & pertinent test results  History of Anesthesia Complications Negative for: history of anesthetic complications  Airway Mallampati: II  TM Distance: >3 FB Neck ROM: Full    Dental  (+) Missing   Pulmonary former smoker   Pulmonary exam normal breath sounds clear to auscultation       Cardiovascular Exercise Tolerance: Good (-) Past MI and (-) CABG negative cardio ROS Normal cardiovascular exam Rhythm:Regular Rate:Normal  ECG 11/20/21:  Sinus rhythm with marked sinus arrhythmia Otherwise normal ECG   Neuro/Psych  PSYCHIATRIC DISORDERS Anxiety Depression    negative neurological ROS     GI/Hepatic negative GI ROS,,,  Endo/Other  negative endocrine ROS    Renal/GU negative Renal ROS   BPH    Musculoskeletal  (+) Arthritis ,    Abdominal   Peds  Hematology negative hematology ROS (+)   Anesthesia Other Findings   Reproductive/Obstetrics                             Anesthesia Physical Anesthesia Plan  ASA: 2  Anesthesia Plan: General   Post-op Pain Management: Minimal or no pain anticipated   Induction: Intravenous  PONV Risk Score and Plan: 2 and TIVA and Propofol infusion  Airway Management Planned: Nasal Cannula and Natural Airway  Additional Equipment: None  Intra-op Plan:   Post-operative Plan: Extubation in OR  Informed Consent: I have reviewed the patients History and Physical, chart, labs and discussed the procedure including the risks, benefits and alternatives for the proposed anesthesia with the patient or authorized representative who has indicated his/her understanding and acceptance.     Dental advisory given  Plan Discussed with: CRNA  Anesthesia Plan Comments: (Discussed risks of anesthesia  with patient, including possibility of difficulty with spontaneous ventilation under anesthesia necessitating airway intervention, PONV, and rare risks such as cardiac or respiratory or neurological events, and allergic reactions. Discussed the role of CRNA in patient's perioperative care. Patient understands.)        Anesthesia Quick Evaluation

## 2022-05-30 NOTE — Anesthesia Procedure Notes (Signed)
Procedure Name: General with mask airway Date/Time: 05/30/2022 9:46 AM  Performed by: Kelton Pillar, CRNAPre-anesthesia Checklist: Patient identified, Emergency Drugs available, Suction available and Patient being monitored Patient Re-evaluated:Patient Re-evaluated prior to induction Oxygen Delivery Method: Simple face mask Induction Type: IV induction Placement Confirmation: positive ETCO2, breath sounds checked- equal and bilateral and CO2 detector Dental Injury: Teeth and Oropharynx as per pre-operative assessment

## 2022-05-30 NOTE — Anesthesia Postprocedure Evaluation (Signed)
Anesthesia Post Note  Patient: Stuart Wise.  Procedure(s) Performed: COLONOSCOPY  Patient location during evaluation: Endoscopy Anesthesia Type: General Level of consciousness: awake and alert Pain management: pain level controlled Vital Signs Assessment: post-procedure vital signs reviewed and stable Respiratory status: spontaneous breathing, nonlabored ventilation, respiratory function stable and patient connected to nasal cannula oxygen Cardiovascular status: blood pressure returned to baseline and stable Postop Assessment: no apparent nausea or vomiting Anesthetic complications: no  No notable events documented.   Last Vitals:  Vitals:   05/30/22 0841 05/30/22 1004  BP: (!) 154/80   Pulse: 80   Resp: 16   Temp: 36.6 C 36.6 C  SpO2: 96%     Last Pain:  Vitals:   05/30/22 1004  TempSrc: Temporal  PainSc:                  Dimas Millin

## 2022-05-30 NOTE — Op Note (Signed)
[facility] Gastroenterology Patient Name: Stuart Wise Procedure Date: 05/30/2022 9:29 AM MRN: [id number] Account #: [id number] Date of Birth: 26-May-1950 Admit Type: Outpatient Age: 72 Room: [facility] ENDO ROOM 2 Gender: Male Note Status: Finalized Instrument Name: Jasper Riling [id number] Procedure:             Colonoscopy Indications:           Surveillance: Personal history of adenomatous polyps                         on last colonoscopy > 5 years ago Providers:             Lorie Apley K. Alice Reichert MD, MD Referring MD:          Wynona Canes. Kym Groom, MD (Referring MD) Medicines:             Propofol per Anesthesia Complications:         No immediate complications. Procedure:             Pre-Anesthesia Assessment:                        - The risks and benefits of the procedure and the                         sedation options and risks were discussed with the                         patient. All questions were answered and informed                         consent was obtained.                        - Patient identification and proposed procedure were                         verified prior to the procedure by the nurse. The                         procedure was verified in the procedure room.                        - ASA Grade Assessment: III - A patient with severe                         systemic disease.                        - After reviewing the risks and benefits, the patient                         was deemed in satisfactory condition to undergo the                         procedure.                        After obtaining informed consent, the colonoscope was  passed under direct vision. Throughout the procedure,                         the patient's blood pressure, pulse, and oxygen                         saturations were monitored continuously. The                         Colonoscope was introduced through the anus and                          advanced to the the cecum, identified by appendiceal                         orifice and ileocecal valve. The colonoscopy was                         performed without difficulty. The patient tolerated                         the procedure well. The quality of the bowel                         preparation was adequate. The ileocecal valve,                         appendiceal orifice, and rectum were photographed. Findings:      The perianal and digital rectal examinations were normal. Pertinent       negatives include normal sphincter tone and no palpable rectal lesions.      Non-bleeding internal hemorrhoids were found during retroflexion. The       hemorrhoids were Grade I (internal hemorrhoids that do not prolapse).      Multiple large-mouthed and small-mouthed diverticula were found in the       entire colon. There was no evidence of diverticular bleeding.      Three carpet-like polyps were found in the ascending colon. The polyps       were 6 to 15 mm in size. These polyps were removed with a hot snare.       Resection and retrieval were complete. To prevent bleeding after the       polypectomy, two hemostatic clips were successfully placed (MR       conditional). Clip manufacturer: [employer]. There was no       bleeding during, or at the end, of the procedure.      The exam was otherwise without abnormality. Impression:            - Non-bleeding internal hemorrhoids.                        - Moderate diverticulosis in the entire examined                         colon. There was no evidence of diverticular bleeding.                        - Three 6 to 15 mm polyps in the ascending colon,  removed with a hot snare. Resected and retrieved.                         Clips (MR conditional) were placed. Clip manufacturer:                         [employer].                        - The examination was otherwise normal. Recommendation:        - Patient  has a contact number available for                         emergencies. The signs and symptoms of potential                         delayed complications were discussed with the patient.                         Return to normal activities tomorrow. Written                         discharge instructions were provided to the patient.                        - Resume previous diet.                        - Continue present medications.                        - Repeat colonoscopy is recommended for surveillance.                         The colonoscopy date will be determined after                         pathology results from today's exam become available                         for review.                        - Return to GI office PRN.                        - The findings and recommendations were discussed with                         the patient. Procedure Code(s):     --- Professional ---                        [phone], Colonoscopy, flexible; with removal of                         tumor(s), polyp(s), or other lesion(s) by snare                         technique Diagnosis Code(s):     --- Professional ---  K57.30, Diverticulosis of large intestine without                         perforation or abscess without bleeding                        D12.2, Benign neoplasm of ascending colon                        K64.0, First degree hemorrhoids                        Z86.010, Personal history of colonic polyps CPT copyright 2022 [employer]. All rights reserved. The codes documented in this report are preliminary and upon coder review may  be revised to meet current compliance requirements. Efrain Sella MD, MD 05/30/2022 10:09:49 AM This report has been signed electronically. Number of Addenda: 0 Note Initiated On: 05/30/2022 9:29 AM Scope Withdrawal Time: 0 hours 12 minutes 29 seconds  Total Procedure Duration: 0 hours 17 minutes 19 seconds   Estimated Blood Loss:  Estimated blood loss: none.      [facility]

## 2022-05-30 NOTE — H&P (Signed)
Outpatient short stay form Pre-procedure 05/30/2022 9:33 AM Najib Colmenares K. Alice Reichert, M.D.  Primary Physician: Johny Drilling, M.D.  Reason for visit:  Personal history of adenomatous colon polyps  History of present illness:  Personal history of colon polyps-due for repeat 5-year colonoscopy. Denies any current GI symptoms. Denies any prior issues with sedation. Proceed with routine colonoscopy.     Current Facility-Administered Medications:    0.9 %  sodium chloride infusion, , Intravenous, Continuous, [place], Benay Pike, MD, Last Rate: 20 mL/hr at 05/30/22 [id number], Continued from Pre-op at 05/30/22 0859  Facility-Administered Medications Ordered in Other Encounters:    0.9 %  sodium chloride infusion, , Intravenous, Continuous, Manya Silvas, MD  Medications Prior to Admission  Medication Sig Dispense Refill Last Dose   atorvastatin (LIPITOR) 40 MG tablet Take 1 tablet by mouth daily.   Past Week   tamsulosin (FLOMAX) 0.4 MG CAPS capsule Take 0.4 mg by mouth in the morning and at bedtime.   Past Week   Cyanocobalamin (VITAMIN B 12 PO) Take by mouth.      fluticasone (FLONASE) 50 MCG/ACT nasal spray Place 1 spray into both nostrils daily. 16 g 0    ibuprofen (ADVIL) 800 MG tablet Take 1 tablet (800 mg total) by mouth every 8 (eight) hours as needed for mild pain or moderate pain. 30 tablet 0    loratadine (CLARITIN) 10 MG tablet Take 10 mg by mouth every morning.      Multiple Vitamin (MULTIVITAMIN WITH MINERALS) TABS tablet Take 1 tablet by mouth daily.      oxyCODONE-acetaminophen (PERCOCET) 5-325 MG tablet Take 1 tablet by mouth every 8 (eight) hours as needed for severe pain. 6 tablet 0    sildenafil (REVATIO) 20 MG tablet Take 20 mg by mouth daily as needed (ED).        Allergies  Allergen Reactions   Prednisone Anxiety     Past Medical History:  Diagnosis Date   Anxiety    Arthritis    osteoarthritis   BPH (benign prostatic hyperplasia)    Colon polyps    Depression     Encounter for colonoscopy due to history of adenomatous colonic polyps    Erectile dysfunction    Hay fever    Hyperlipemia    Mass of pancreas     Review of systems:  Otherwise negative.    Physical Exam  Gen: Alert, oriented. Appears stated age.  HEENT: [place]/AT. PERRLA. Lungs: CTA, no wheezes. CV: RR nl S1, S2. Abd: soft, benign, no masses. BS+ Ext: No edema. Pulses 2+    Planned procedures: Proceed with colonoscopy. The patient understands the nature of the planned procedure, indications, risks, alternatives and potential complications including but not limited to bleeding, infection, perforation, damage to internal organs and possible oversedation/side effects from anesthesia. The patient agrees and gives consent to proceed.  Please refer to procedure notes for findings, recommendations and patient disposition/instructions.     Khaliel Morey K. Alice Reichert, M.D. Gastroenterology 05/30/2022  9:33 AM

## 2022-05-30 NOTE — Interval H&P Note (Signed)
History and Physical Interval Note:  05/30/2022 9:34 AM  Stuart Wise.  has presented today for surgery, with the diagnosis of History of colon polyps (Z86.010).  The various methods of treatment have been discussed with the patient and family. After consideration of risks, benefits and other options for treatment, the patient has consented to  Procedure(s): COLONOSCOPY (N/A) as a surgical intervention.  The patient's history has been reviewed, patient examined, no change in status, stable for surgery.  I have reviewed the patient's chart and labs.  Questions were answered to the patient's satisfaction.     [place], [place]

## 2022-05-30 NOTE — Transfer of Care (Signed)
Immediate Anesthesia Transfer of Care Note  Patient: Stuart Wise.  Procedure(s) Performed: COLONOSCOPY  Patient Location: Endoscopy Unit  Anesthesia Type:General  Level of Consciousness: drowsy and patient cooperative  Airway & Oxygen Therapy: Patient Spontanous Breathing and Patient connected to face mask oxygen  Post-op Assessment: Report given to RN and Post -op Vital signs reviewed and stable  Post vital signs: Reviewed and stable  Last Vitals:  Vitals Value Taken Time  BP 101/60 05/30/22 1004  Temp    Pulse 77 05/30/22 1004  Resp 13 05/30/22 1004  SpO2 98 % 05/30/22 1004  Vitals shown include unvalidated device data.  Last Pain:  Vitals:   05/30/22 0841  TempSrc: Temporal  PainSc: 0-No pain         Complications: No notable events documented.

## 2022-05-31 ENCOUNTER — Encounter: Payer: Self-pay | Admitting: Internal Medicine

## 2022-05-31 LAB — SURGICAL PATHOLOGY

## 2023-10-14 ENCOUNTER — Ambulatory Visit
Admission: RE | Admit: 2023-10-14 | Discharge: 2023-10-14 | Disposition: A | Discharge status: Home / Self Care | Source: Ambulatory Visit | Attending: Family Medicine | Admitting: Family Medicine

## 2023-10-14 ENCOUNTER — Other Ambulatory Visit: Payer: Self-pay | Admitting: Family Medicine

## 2023-10-14 ENCOUNTER — Ambulatory Visit
Admission: RE | Admit: 2023-10-14 | Discharge: 2023-10-14 | Disposition: A | Discharge status: Home / Self Care | Attending: Family Medicine | Admitting: Family Medicine

## 2023-10-14 DIAGNOSIS — G8929 Other chronic pain: Secondary | ICD-10-CM

## 2024-05-01 ENCOUNTER — Other Ambulatory Visit: Payer: Self-pay | Admitting: Physical Medicine & Rehabilitation

## 2024-05-01 DIAGNOSIS — G8929 Other chronic pain: Secondary | ICD-10-CM
